# Patient Record
Sex: Male | Born: 1975 | Race: Asian | Hispanic: No | Marital: Single | Smoking: Former smoker
Health system: Southern US, Community
[De-identification: ages and names within clinical notes are randomized; demographics above are authoritative.]

## PROBLEM LIST (undated history)

## (undated) DIAGNOSIS — R109 Unspecified abdominal pain: Secondary | ICD-10-CM

## (undated) DIAGNOSIS — K219 Gastro-esophageal reflux disease without esophagitis: Secondary | ICD-10-CM

## (undated) HISTORY — DX: Gastro-esophageal reflux disease without esophagitis: K21.9

---

## 2015-02-16 ENCOUNTER — Ambulatory Visit (INDEPENDENT_AMBULATORY_CARE_PROVIDER_SITE_OTHER): Payer: PRIVATE HEALTH INSURANCE | Admitting: Family Medicine

## 2015-02-16 ENCOUNTER — Ambulatory Visit (INDEPENDENT_AMBULATORY_CARE_PROVIDER_SITE_OTHER): Payer: PRIVATE HEALTH INSURANCE

## 2015-02-16 VITALS — BP 132/92 | HR 64 | Temp 97.3°F | Resp 18 | Ht 64.0 in | Wt 113.0 lb

## 2015-02-16 DIAGNOSIS — Z131 Encounter for screening for diabetes mellitus: Secondary | ICD-10-CM | POA: Diagnosis not present

## 2015-02-16 DIAGNOSIS — Z1329 Encounter for screening for other suspected endocrine disorder: Secondary | ICD-10-CM

## 2015-02-16 DIAGNOSIS — R0789 Other chest pain: Secondary | ICD-10-CM

## 2015-02-16 DIAGNOSIS — Z13220 Encounter for screening for lipoid disorders: Secondary | ICD-10-CM | POA: Diagnosis not present

## 2015-02-16 DIAGNOSIS — M779 Enthesopathy, unspecified: Secondary | ICD-10-CM

## 2015-02-16 LAB — POCT CBC
Granulocyte percent: 51.8 %G (ref 37–80)
HCT, POC: 44.9 % (ref 43.5–53.7)
Hemoglobin: 14.3 g/dL (ref 14.1–18.1)
Lymph, poc: 4.7 — AB (ref 0.6–3.4)
MCH, POC: 29.8 pg (ref 27–31.2)
MCHC: 31.8 g/dL (ref 31.8–35.4)
MCV: 93.5 fL (ref 80–97)
MID (cbc): 0.7 (ref 0–0.9)
MPV: 7 fL (ref 0–99.8)
POC Granulocyte: 5.7 (ref 2–6.9)
POC LYMPH PERCENT: 42.1 %L (ref 10–50)
POC MID %: 6.1 %M (ref 0–12)
Platelet Count, POC: 343 10*3/uL (ref 142–424)
RBC: 4.8 M/uL (ref 4.69–6.13)
RDW, POC: 13.6 %
WBC: 11.1 10*3/uL — AB (ref 4.6–10.2)

## 2015-02-16 LAB — POCT GLYCOSYLATED HEMOGLOBIN (HGB A1C): Hemoglobin A1C: 5

## 2015-02-16 LAB — TROPONIN I: Troponin I: <0.01 ng/mL (ref <0.06)

## 2015-02-16 NOTE — Progress Notes (Signed)
Chief Complaint:  Chief Complaint  Patient presents with  . Chest Pain    x 2 weeks  . Arm Pain    x 2 weeks    HPI: Trevor Stanton is a 39 y.o. male who is here for  2-3 week history of these chest wall pain, more on the left side, associated with arm pain, numbness and tingling. He has had intermittent to constant chest wall pain, it can last all day long. It is not associated with rest or exertion onset, however it gets worse with exertion especially at work. Remember what triggered it. Has chest pain and arm pain when he moves in bed. 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 He uses his left hand many times to lift since he partners up with someone else on the other side. He is right-hand dominant. He denies any neck pain, shoulder pain, prior history of rotator cuff issues or elbow tendinitis. He denies any history of GERD, denies any prior history of tendinitis, he has had similar symptoms in the past in thick mom about 7-8 years ago involving chest wall pain and was worked up by a cardiologist. He is given a medication but does not know what it is called. He got relief from it has not taken it since.  Denies any recent cold symptoms, cough, allergies, respiratory illnesses, night sweats, chills, weight loss. He denies any shortness of breath, palpitations, diaphoresis, headache, nausea, vomiting, abdominal pain. Was a former smoker. About 4 years ago. Denies any hypertension, diabetes, family history of premature heart attacks, hyperlipidemia. Does not drink alcohol except socially.  History reviewed. No pertinent past medical history. History reviewed. No pertinent past surgical history. History   Social History  . Marital Status: Single    Spouse Name: N/A  . Number of Children: N/A  . Years of Education: N/A   Social History Main Topics  . Smoking status: Former Smoker -- 0.50 packs/day for 3 years    Types: Cigarettes  .  Smokeless tobacco: Not on file  . Alcohol Use: Not on file  . Drug Use: Not on file  . Sexual Activity: Not on file   Other Topics Concern  . None   Social History Narrative  . None   Family History  Problem Relation Age of Onset  . Diabetes Mother   . Hypertension Mother   . Hyperlipidemia Mother    No Known Allergies Prior to Admission medications   Not on File     ROS: The patient denies fevers, chills, night sweats, unintentional weight loss, palpitations, wheezing, dyspnea on exertion, nausea, vomiting, abdominal pain, dysuria, hematuria, melena,  weakness  All other systems have been reviewed and were otherwise negative with the exception of those mentioned in the HPI and as above.    PHYSICAL EXAM: Filed Vitals:   02/16/15 1610  BP: 132/92  Pulse: 64  Temp: 97.3 F (36.3 C)  Resp: 18   Filed Vitals:   02/16/15 1610  Height: 5\' 4"  (1.626 m)  Weight: 113 lb (51.256 kg)   Body mass index is 19.39 kg/(m^2).  General: Alert, no acute distress HEENT:  Normocephalic, atraumatic, oropharynx patent. EOMI, PERRLA, fundoscopic exam normal,  Cardiovascular:  Regular rate and rhythm, no rubs murmurs or gallops.  No Carotid bruits, radial pulse intact. No pedal edema.  + chest wall tenderness on palpation Respiratory: Clear to auscultation bilaterally.  No wheezes, rales, or  rhonchi.  No cyanosis, no use of accessory musculature GI: No organomegaly, abdomen is soft and non-tender, positive bowel sounds.  No masses. Skin: No rashes. Neurologic: Facial musculature symmetric. CN 2 to 12 grossly intact Psychiatric: Patient is appropriate throughout our interaction. Lymphatic: No cervical lymphadenopathy Musculoskeletal: Gait intact. 5/5 strength, 2/2 DTRs, UE and LE sensation intact He has + left  lateral epicondylitis.    LABS: Results for orders placed or performed in visit on 02/16/15  POCT CBC  Result Value Ref Range   WBC 11.1 (A) 4.6 - 10.2 K/uL   Lymph, poc  4.7 (A) 0.6 - 3.4   POC LYMPH PERCENT 42.1 10 - 50 %L   MID (cbc) 0.7 0 - 0.9   POC MID % 6.1 0 - 12 %M   POC Granulocyte 5.7 2 - 6.9   Granulocyte percent 51.8 37 - 80 %G   RBC 4.80 4.69 - 6.13 M/uL   Hemoglobin 14.3 14.1 - 18.1 g/dL   HCT, POC 44.9 43.5 - 53.7 %   MCV 93.5 80 - 97 fL   MCH, POC 29.8 27 - 31.2 pg   MCHC 31.8 31.8 - 35.4 g/dL   RDW, POC 13.6 %   Platelet Count, POC 343 142 - 424 K/uL   MPV 7.0 0 - 99.8 fL  POCT glycosylated hemoglobin (Hb A1C)  Result Value Ref Range   Hemoglobin A1C 5.0      EKG/XRAY:   Primary read interpreted by Dr. Marin Comment at [facility]. Neg for any acute cardiopulm process  ASSESSMENT/PLAN: Encounter Diagnoses  Name Primary?  . Other chest pain Yes  . Screening for thyroid disorder   . Screening for diabetes mellitus   . Screening for hyperlipidemia   . Chest wall pain   . Tendonitis    Chest pain presentation questionable etiology of costochondritis versus less likely MI. He has pain of the chest wall on palpation and range of motion. He has a normal chest x-ray and a borderline EKG with nonspecific ST changes. There was a slight leukocytosis but I think this is all inflammatory. Do not suspect that it is MI in etiology.  Labs pending: stat troponin i, cmp, tsh, lipid Refer to cardiology Work note given I will ask him to take tylenol for this, hold off on NSAIDs until eval by cardiology or labs are normal Precautions given to go to the ER as needed   Gross sideeffects, risk and benefits, and alternatives of medications d/w patient. Patient is aware that all medications have potential sideeffects and we are unable to predict every sideeffect or drug-drug interaction that may occur.  LE, Shipman, DO 02/16/2015 5:11 PM

## 2015-02-16 NOTE — Patient Instructions (Signed)

## 2015-02-17 LAB — LIPID PANEL
Cholesterol: 177 mg/dL (ref 0–200)
HDL: 63 mg/dL (ref >=40)
LDL Cholesterol: 98 mg/dL (ref 0–99)
Total CHOL/HDL Ratio: 2.8 Ratio
Triglycerides: 79 mg/dL (ref <150)
VLDL: 16 mg/dL (ref 0–40)

## 2015-02-17 LAB — COMPREHENSIVE METABOLIC PANEL
ALT: 13 U/L (ref 0–53)
AST: 18 U/L (ref 0–37)
Alkaline Phosphatase: 69 U/L (ref 39–117)
Sodium: 137 mEq/L (ref 135–145)
Total Bilirubin: 0.4 mg/dL (ref 0.2–1.2)
Total Protein: 7.7 g/dL (ref 6.0–8.3)

## 2015-02-17 LAB — COMPREHENSIVE METABOLIC PANEL WITH GFR
Albumin: 4.8 g/dL (ref 3.5–5.2)
BUN: 16 mg/dL (ref 6–23)
CO2: 26 meq/L (ref 19–32)
Calcium: 9.8 mg/dL (ref 8.4–10.5)
Chloride: 100 meq/L (ref 96–112)
Creat: 0.76 mg/dL (ref 0.50–1.35)
Glucose, Bld: 85 mg/dL (ref 70–99)
Potassium: 4 meq/L (ref 3.5–5.3)

## 2015-02-17 LAB — TSH: TSH: 3.679 u[IU]/mL (ref 0.350–4.500)

## 2015-02-20 ENCOUNTER — Encounter: Payer: Self-pay | Admitting: Family Medicine

## 2015-05-22 ENCOUNTER — Encounter: Payer: Self-pay | Admitting: Cardiology

## 2015-05-23 NOTE — Progress Notes (Signed)
This encounter was created in error - please disregard.

## 2019-08-01 ENCOUNTER — Ambulatory Visit: Payer: Self-pay | Admitting: Nurse Practitioner

## 2019-08-07 ENCOUNTER — Ambulatory Visit: Payer: Self-pay | Admitting: Nurse Practitioner

## 2019-08-13 ENCOUNTER — Encounter: Payer: Self-pay | Admitting: Nurse Practitioner

## 2019-08-13 ENCOUNTER — Other Ambulatory Visit: Payer: Self-pay

## 2019-08-13 ENCOUNTER — Ambulatory Visit (INDEPENDENT_AMBULATORY_CARE_PROVIDER_SITE_OTHER): Payer: BLUE CROSS/BLUE SHIELD | Admitting: Nurse Practitioner

## 2019-08-13 VITALS — BP 122/85 | HR 86 | Ht 63.0 in | Wt 108.6 lb

## 2019-08-13 DIAGNOSIS — K2950 Unspecified chronic gastritis without bleeding: Secondary | ICD-10-CM | POA: Diagnosis not present

## 2019-08-13 DIAGNOSIS — Z7689 Persons encountering health services in other specified circumstances: Secondary | ICD-10-CM | POA: Diagnosis not present

## 2019-08-13 DIAGNOSIS — R3914 Feeling of incomplete bladder emptying: Secondary | ICD-10-CM | POA: Diagnosis not present

## 2019-08-13 DIAGNOSIS — N401 Enlarged prostate with lower urinary tract symptoms: Secondary | ICD-10-CM | POA: Diagnosis not present

## 2019-08-13 MED ORDER — OMEPRAZOLE 20 MG PO CPDR: 20.0000 mg | DELAYED_RELEASE_CAPSULE | Freq: Every day | ORAL | 3 refills | Status: DC

## 2019-08-13 MED ORDER — TAMSULOSIN HCL 0.4 MG PO CAPS: 0.4000 mg | ORAL_CAPSULE | Freq: Every day | ORAL | 3 refills | Status: DC

## 2019-08-13 NOTE — Patient Instructions (Addendum)
Trevor Stanton,   Thank you for coming in to clinic today.  1. START omeprazole 20 mg once daily about 30 minutes before first meal.  B?nh tro ng??c d? dy th?c qu?n, Ng??i l?n Gastroesophageal Reflux Disease, Adult Tro ng??c d? dy th?c qu?n (GER) x?y ra khi axit t? d? dy tro ln ?ng k?t n?i mi?ng v d? dy (th?c qu?n). Thng th??ng, th?c ?n di chuy?n xu?ng th?c qu?n v ? trong d? dy ?? tiu ha. Tuy nhin, khi m?t ng??i b? GER, th?c ?n v axit trong d? dy ?i khi tro ng??c tr? [name] th?c qu?n. N?u tnh tr?ng ny tr? thnh m?t v?n ?? nghim tr?ng h?n, ng??i ? c th? ???c ch?n ?on b? b?nh g?i l b?nh tro ng??c d? dy th?c qu?n (GERD). GERD x?y ra khi tro ng??c:  Xu?t hi?n th??ng xuyn.  Gy ra cc tri?u ch?ng th??ng xuyn ho?c n?ng.  Gy ra cc v?n ?? nh? th??ng t?n th?c qu?n. Khi axit d? dy ti?p xc v?i th?c qu?n, axit c th? gy lot (vim) trong th?c qu?n. Theo th?i gian, GERD c th? t?o ra cc l? nh? (v?t lot) ? nim m?c th?c qu?n. Nguyn nhn g gy ra? Tnh tr?ng ny do m?t v?n ?? c?a ph?n c? gi?a th?c qu?n v d? dy (c? th?t th?c qu?n d??i, hay l lower esophageal sphincter, LES) gy ra. Thng th??ng c? LES ?ng l?i sau khi th?c ?n ?i qua th?c qu?n vo d? dy. Khi LES b? y?u ho?c b?t th??ng, c? khng ?ng theo ?ng cch v ?i?u ? khi?n cho th?c ?n v a xt d? dy tro ng??c tr? l?i th?c qu?n. LES c th? b? y?u do m?t s? ch?t ?n [place] nh?t ??nh, thu?c v cc tnh tr?ng b?nh l, bao g?m:  S? d?ng thu?c l.  Mang thai.  Thot v? honh.  S? d?ng r??u.  M?t s? lo?i th?c ?n v ?? u?ng nh?t ??nh, nh? c ph, s c la, hnh v b?c h. ?i?u g lm t?ng nguy c?? Tnh tr?ng ny d? x?y ra h?n n?u qu v?:  B? t?ng cn.  B? r?i lo?n m lin k?t.  S? d?ng thu?c ch?ng vim khng steroid (NSAID). Cc d?u hi?u ho?c tri?u ch?ng l g? Nh?ng tri?u ch?ng c?a tnh tr?ng ny bao g?m:  ? nng.  Kh nu?t ho?c ?au khi nu?t.  C?m th?y nh? c m?t c?c trong h?ng.  C?m gic ??ng trong  mi?ng.  H?i th? hi.  C nhi?u n??c b?t.  C?m gic kh ch?u trong b?ng ho?c ch??ng b?ng.  ? h?i.  ?au ng?c. Nh?ng tnh tr?ng khc nhau c th? gy ?au ng?c. B?o ??m qu v? ?i khm chuyn gia ch?m [name] s?c kh?e n?u qu v? b? ?au ng?c.  Kh th? ho?c th? kh kh.  Ho lin t?c (m?n tnh) ho?c ho vo ban ?m.  B? h?ng l?p men r?ng.  S?t cn. Ch?n ?on tnh tr?ng ny nh? th? no? Chuyn gia ch?m [name] s?c kh?e c?a qu v? s? h?i v? b?nh s? v khm th?c th? cho qu v?. ?? xc ??nh qu v? b? GERD nh? hay n?ng, chuyn gia ch?m  s?c kh?e c?ng c th? theo di qu v? ?p ?ng v?i vi?c ?i?u tr? nh? th? no. Qu v? c?ng c th? ???c lm cc ki?m tra, bao g?m:  M?t ki?m tra ?? khm d? dy v th?c qu?n b?ng m?t camera nh? (n?i soi).  Ki?m tra ?o n?ng ?? a  xt trong th?c qu?n c?a qu v?.  Ki?m tra ?o m?c p l?c ln th?c qu?n c?a qu v?.  Ki?m tra nu?t bari ho?c nu?t bari bi?n tnh ?? cho th?y hnh d?ng, kch th??c v ch?c n?ng c?a th?c qu?n c?a qu v?. Tnh tr?ng ny ???c ?i?u tr? nh? th? no? M?c tiu c?a ?i?u tr? l gip lm gi?m cc tri?u ch?ng v ng?n ng?a bi?n ch?ng. Vi?c ?i?u tr? b?nh ny c th? khc nhau ty thu?c m?c ?? n?ng c?a tri?u ch?ng. Chuyn gia ch?m [name] s?c kh?e c?a qu v? c th? khuy?n ngh?:  Thay ??i ch? ?? ?n.  Thu?c.  Ph?u thu?t. Tun th? nh?ng h??ng d?n ny ? nh: ?n v u?ng   Tun th? m?t ch? ?? ?n theo khuy?n ngh? c?a chuyn gia ch?m [name] s?c kh?e. Vi?c ny c th? l trnh cc th?c ?n v ?? u?ng nh?: ? C ph v tr (c ho?c khng c caffeine). ? ?? u?ng cr??u. ? ?? u?ng t?ng l?c v ?? u?ng dng trong th? thao. ? ?? u?ng c ga ho?c soda. ? S-c-la v cacao. ? B?c h v h??ng b?c h. ? T?i v hnh. ? C?i ng?a (Horseradish). ? Cc th?c ?n nhi?u gia v? v axit, bao g?m h?t tiu, b?t ?t, b?t cri, gi?m, n??c s?t cay v n??c s?t th?t n??ng. ? N??c qu? ho?c qu? h? cam qut, ch?ng h?n nh? cam, chanh v chanh l cam. ? Cc th?c ?n c c chua, nh? n??c x?t ??, ?t, n??c  x?t salsa v pizza km x?t ??. ? Th?c ?n chin v nhi?u ch?t bo, ch?ng h?n nh? bnh rn, khoai ty chin, khoai ty rn v n??c x?t nhi?u ch?t bo. ? Th?t nhi?u ch?t bo, ch?ng h?n nh? hot dog (bnh m k?p xc xch) v cc lo?i th?t ?? v tr?ng nhi?u m?, ch?ng h?n nh? th?t n?c l?ng, xc xch, gi?m bng v th?t l?n xng khi. ? Nh?ng s?n ph?m b? s?a giu ch?t bo, nh? s?a nguyn kem, b? v pho mt kem.  ?n cc b?a nh?, th??ng xuyn thay v cc b?a no.  Trnh u?ng nhi?u n??c khi ?n.  Trnh ?n trong kho?ng 2-3 gi? tr??c khi ?i ng?.  Trnh n?m xu?ng ngay sau khi ?n.  Khng t?p th? d?c ngay sau khi ?n. L?i s?ng   Khng s? d?ng b?t k? s?n ph?m no c nicotine ho?c thu?c l, ch?ng ha?n nh? thu?c l d?ng ht, thu?c l ?i?n t? v thu?c l d?ng nhai. N?u qu v? c?n gip ?? ?? cai thu?c, hy h?i chuyn gia ch?m [name] s?c kh?e.  C? g?ng gi?m c?ng th?ng b?ng cch s? d?ng cc ph??ng php nh? yoga ho?c thi?n. N?u qu v? c?n gip ?? ?? lm gi?m c?ng th?ng, hy h?i chuyn gia ch?m [name] s?c kh?e.  N?u qu v? th?a cn, hy gi?m cn v? m?c c l?i cho s?c kh?e c?a qu v?. Hy h?i chuyn gia ch?m [name] s?c kh?e ?? ???c h??ng d?n v? m?c tiu gi?m cn an ton. H??ng d?n chung  Ch  ??n b?t c? thay ??i no v? tri?u ch?ng c?a qu v?.  Ch? s? d?ng thu?c khng k ??n v thu?c k ??n theo ch? d?n c?a chuyn gia ch?m [place] s?c kh?e. Khng dng aspirin, ibuprofen, ho?c cc thu?c ch?ng vim khng steroid (NSAID) khc tr? khi chuyn gia ch?m [name] s?c kh?e c?a qu v? cho php.  M?c qu?n o r?ng. Khng m?c ci g ch?t  quanh eo m c th? t?o p l?c ln b?ng.  Nng (nng cao) ??u gi??ng c?a qu v? thm 6 ins? (15 cm).  Trnh g?p ng??i n?u ?i?u ny khi?n cc tri?u ch?ng tr? nn tr?m tr?ng h?n.  Tun th? t?t c? cc l?n khm theo di theo ch? d?n c?a chuyn gia ch?m [name] s?c kh?e. ?i?u ny c vai tr quan tr?ng. Hy lin l?c v?i chuyn gia ch?m [name] s?c kh?e n?u:  Qu v? bi?: ? Cc tri?u ch?ng m?i. ? S?t cn khng r nguyn  nhn. ? Kh nu?t ho?c b? ?au khi nu?t. ? Th? kh kh ho?c ho dai d?ng. ? Gi?ng khn.  Cc tri?u ch?ng c?a qu v? khng c?i thi?n sau khi ???c ?i?u tr?Trevor Stanton c?u tr? gip ngay l?p t?c n?u qu v?:  B? ?au ? cnh tay, c?, hm, r?ng ho?c l?ng.  C?m th?y ?? m? hi, chng m?t ho?c chong vng.  B? ?au ng?c ho?c kh th?.  Nn v ch?t nn ra gi?ng nh? mu ho?c b c ph.  Ng?t x?u.  C phn l?n mu ho?c mu ?en.  Khng th? nu?t, u?ng ho?c ?n. Tm t?t  Tro ng??c d? dy th?c qu?n x?y ra khi axit t? d? dy tro ln th?c qu?n. GERD l b?nh trong ? tro ng??c x?y ra th??ng xuyn, gy ra cc tri?u ch?ng n?ng ho?c th??ng xuyn, ho?c gy ra cc v?n ?? nh? t?n th??ng th?c qu?n.  Vi?c ?i?u tr? b?nh ny c th? khc nhau ty thu?c m?c ?? n?ng c?a tri?u ch?ng. Chuyn gia ch?m [place] s?c kh?e c th? khuy?n ngh? thay ??i ch? ?? ?n u?ng v l?i s?ng, dng thu?c ho?c ph?u thu?t.  Hy lin h? v?i chuyn gia ch?m [place] s?c kh?e n?u qu v? c tri?u ch?ng m?i ho?c tri?u ch?ng ? nn tr?m tr?ng h?n.  Ch? s? d?ng thu?c khng k ??n v thu?c k ??n theo ch? d?n c?a chuyn gia ch?m [place] s?c kh?e. Khng dng aspirin, ibuprofen, ho?c cc thu?c ch?ng vim khng steroid (NSAID) khc tr? khi chuyn gia ch?m [place] s?c kh?e c?a qu v? cho php.  Tun th? t?t c? cc l?n khm theo di theo ch? d?n c?a chuyn gia ch?m [place] s?c kh?e. ?i?u ny c vai tr quan tr?ng. Thng tin ny khng nh?m m?c ?ch thay th? cho l?i khuyn m chuyn gia ch?m [place] s?c kh?e ni v?i qu v?. Hy b?o ??m qu v? ph?i th?o lu?n b?t k? v?n ?? g m qu v? c v?i chuyn gia ch?m  s?c kh?e c?a qu v?. Document Released: 08/25/2005 Document Revised: 06/23/2018 Document Reviewed: 06/23/2018 Elsevier Patient Education  [place].   T?ng s?n tuy?n ti?n li?t lnh tnh Benign Prostatic Hyperplasia  T?ng s?n tuy?n ti?n li?t lnh tnh (BPH) l tuy?n ti?n li?t ph ??i do qu trnh lo ha bnh th??ng ch? khng ph?i do ung th? gy ra. Tuy?n ti?n li?t l  tuy?n c kch th??c b?ng qu? h? ?o, tham gia vo qu trnh s?n xu?t tinh d?ch. N n?m pha tr??c tr?c trng v bn d??i bng quang. Bng quang l?u tr? n??c ti?u v ni?u ??o l ?ng ??a n??c ti?u ra kh?i c? th?. Tuy?n ti?n li?t c th? l?n h?n khi ?n ng nhi?u tu?i h?n. Tuy?n ti?n li?t ph ??i c th? chn p vo ni?u ??o. ?i?u ny c th? lm cho kh ?i ti?u h?n. Tch t? n??c ti?u trong bng quang c th? gy nhi?m trng. p  l?c ng??c dng v nhi?m trng c th? ti?n tri?n thnh t?n th??ng bng quang v suy th?n. Nguyn nhn g gy ra? Tnh tr?ng ny l m?t ph?n c?a qu trnh lo ha bnh th??ng. Tuy nhin, khng ph?i t?t c? nam gi?i ??u b? cc v?n ?? c?a tnh tr?ng ny. N?u tuy?n ti?n li?t ph ??i ra xa ni?u ??o, dng n??c ti?u s? khng b? ch?n. N?u n ph ??i h??ng v? ni?u ??o v chn p ni?u ??o, s? c v?n ?? v? ti?u ti?n. ?i?u g lm t?ng nguy c?? Tnh tr?ng ny d? x?y ra h?n ? nam gi?i trn 50 tu?i. Cc d?u hi?u ho?c tri?u ch?ng g? Nh?ng tri?u ch?ng c?a tnh tr?ng ny bao g?m:  Th?c d?y th??ng xuyn trong ?m ?? ?i ti?u.  C?n ?i ti?u th??ng xuyn trong ngy.  Kh b?t ??u dng n??c ti?u.  Gi?m kch th??c v ?? m?nh c?a dng n??c ti?u.  R? (nh? gi?t) sau khi ti?u ti?n.  Khng th? ?i ti?u. Tnh tr?ng ny c?n ph?i ?i?u tr? ngay l?p t?c.  Khng th? ?i h?t n??c ti?u trong bng quang.  ?au khi ?i ti?u. Tnh tr?ng ny ph? bi?n h?n n?u c?ng km theo nhi?m trng.  Nhi?m trng ???ng ti?u (UTI). Ch?n ?on tnh tr?ng ny nh? th? no? Tnh tr?ng ny ???c ch?n ?on d?a vo khm th?c th?, khai thc b?nh s? v cc tri?u ch?ng c?a qu v?. Cc ki?m tra c?ng s? ???c th?c hi?n, ch?ng h?n nh?:  Ch?p bng quang sau khi ?i ti?u. Bi?n php ny ?o b?t c? l??ng n??c ti?u no cn l?i trong bng quang sau khi qu v? ?i ti?u xong.  Khm tr?c trng b?ng ngn tay. Trong l?n ki?m tra tr?c trng, chuyn gia ch?m [place] s?c kh?e s? ki?m tra tuy?n ti?n li?t c?a qu v? b?ng cch cho m?t ngn tay ?eo g?ng, ? bi tr?n vo  tr?c trng ?? s? m?t sau c?a tuy?n ti?n li?t. Ki?m tra ny pht hi?n kch th??c c?a tuy?n v b?t c? kh?i u ho?c s? pht tri?n b?t th??ng no.  Xt nghi?m n??c ti?u (phn tch n??c ti?u).  Sng l?c khng nguyn ??c hi?u tuy?n ti?n li?t (PSA). ?y l m?t xt nghi?m mu ???c s? d?ng ?? sng l?c ung th? tuy?n ti?n li?t.  Siu m. Ki?m tra ny s? d?ng sng m thanh ?? t?o hnh ?nh tuy?n ti?n li?t b?ng ph??ng php ?i?n t?. Chuyn gia ch?m [place] s?c kh?e c th? gi?i thi?u qu v? ??n m?t chuyn gia v? b?nh th?n v b?nh tuy?n ti?n li?t (bc s? chuyn khoa ti?t ni?u). Tnh tr?ng ny ???c ?i?u tr? nh? th? no? Khi b?t ??u c tri?u ch?ng, chuyn gia ch?m [place] s?c kh?e s? theo di tnh tr?ng c?a qu v? (theo di tch c?c ho?c ch? tch c?c). Vi?c ?i?u tr? ti?nh tra?ng ny ty thu?c vo m??c ?? n?ng c?a b?nh. ?i?u tr? c th? bao g?m:  Theo di v khm th??ng nin. ?y c th? l cch ?i?u tr? duy nh?t c?n thi?t n?u tnh tr?ng v cc tri?u ch?ng c?a qu v? nh?.  Thu?c ?? gia?m nhe? ca?c tri?u ch??ng, bao g?m: ? Thu?c lm thu nh? tuy?n ti?n li?t. ? Thu?c ?? th? gin cc c? c?a tuy?n ti?n li?t.  Ph?u thu?t trong tr??ng h?p n?ng. Ph?u thu?t c th? bao g?m: ? C?t b? tuy?n ti?n li?t. Trong th? thu?t ny, m tuy?n ti?n li?t ???c ???c lo?i b? hon ton qua m?t  v?t m? m? ho?c qua n?i soi ? b?ng ho?c robot. ? C?t tuy?n ti?n li?t qua ni?u ??o (TURP). Trong th? thu?t ny, m?t d?ng c? ???c lu?n qua l? trn ??u d??ng v?t (ni?u ??o). N ???c s? d?ng ?? c?t b? m li bn trong c?a tuy?n ti?n li?t. Cc m?nh ny ???c lo?i b? thng qua cng m?t l? ? ??u d??ng v?t. Ph??ng php ny gip lo?i b? t?c ngh?n. ? R?ch qua ni?u ??o (TUIP). Trong th? thu?t ny, cc v?t r?ch nh? ???c t?o ra trong tuy?n ti?n li?t. Th? thu?t ny lm gi?m p l?c c?a tuy?n ti?n li?t ln ni?u ??o. ? Li?u php nhi?t vi sng ni?u ??o (TUMT). Th? thu?t ny s? d?ng vi sng ?? t?o ra nhi?t. Nhi?t s? ph h?y v lo?i b? m?t l??ng nh? m tuy?n ti?n li?t. ? B?c h?i  tuy?n ti?n li?t b?ng kim qua ni?u ??o (TUNA). Th? thu?t ny s? d?ng t?n s? radio ?? ph h?y v lo?i b? m?t l??ng nh? m tuy?n ti?n li?t. ? ?ng t? b?ng laser qua k? ([place]). Th? thu?t ny s? d?ng tia laser ?? ph h?y v lo?i b? m?t l??ng nh? m tuy?n ti?n li?t. ? Lm b?c h?i tuy?n ti?n li?t qua ni?u ??o (TUVP). Th? thu?t ny s? d?ng cc ?i?n c?c ?? ph h?y v lo?i b? m?t l??ng nh? m tuy?n ti?n li?t. ? Nng ni?u ??o tuy?n ti?n li?t. Th? thu?t ny ??a m?t m c?y vo ?? ??y cc thy c?a tuy?n ti?n li?t ra xa ni?u ??o. Tun th? nh?ng h??ng d?n ny ? nh:  Ch? s? d?ng thu?c khng k ??n v thu?c k ??n theo ch? d?n c?a chuyn gia ch?m [place] s?c kh?e.  Theo di cc tri?u ch?ng xem c b?t c? thay ??i no khng. Hy trao ??i v?i chuyn gia ch?m [place] s?c kh?e n?u c b?t k? thay ??i no.  Trnh u?ng nhi?u n??c tr??c khi ?i ng? ho?c ra ngoi n?i cng c?ng.  Trnh u?ng ho?c gi?m l??ng caffeine ho?c gi?m l??ng r??u qu v? u?ng.  T? cho qu v? th?i gian khi ti?u ti?n.  Tun th? t?t c? cc l?n khm theo di theo ch? d?n c?a chuyn gia ch?m [place] s?c kh?e. ?i?u ny c vai tr quan tr?ng. Hy lin l?c v?i chuyn gia ch?m [place] s?c kh?e n?u:  Qu v? b? ?au l?ng khng r nguyn nhn.  Cc tri?u ch?ng c?a qu v? khng ?? h?n sau khi ???c ?i?u tr?Sander Nephew v? b? nh?ng tc d?ng ph? c?a thu?c m qu v? ?ang dng.  N??c ti?u c?a qu v? tr? nn r?t s?m mu ho?c c mi hi.  Vng b?ng d??i c?a qu v? ch??ng ln v qu v? ?i ti?u kh kh?n. Yu c?u tr? gip ngay l?p t?c n?u:  Qu v? b? s?t ho?c ?n l?nh.  Qu v? ??t nhin khng th? ti?u ti?n.  Qu v? c?m th?y chong vng, ho?c r?t chng m?t, ho?c ng?t x?u.  C m?t l??ng l?n mu ho?c c?c mu ?ng trong n??c ti?u.  V?n ?? ti?u ti?n tr? ln kh qu?n l.  Qu v? b? ?au th?t l?ng ho?c ?au m?ng s??n t? m?c ?? trung bnh ??n m?c ?? n?ng. M?ng s??n l phn bn c? th? gi?a x??ng s??n v hng. Nh?ng tri?u ch?ng ny c th? l bi?u hi?n c?a m?t v?n ?? nghim tr?ng c?n c?p c?u.  Khng ch? xem tri?u ch?ng c h?t khng. Hy ?i khm ngay l?p  t?c. G?i cho d?ch v? c?p c?u t?i ??a ph??ng (911 ? [place] Khng t? li xe ??n b?nh vi?n. Tm t?t  T?ng s?n tuy?n ti?n li?t lnh tnh ([facility]) l tuy?n ti?n li?t ph ??i do qu trnh lo ha bnh th??ng ch? khng ph?i do ung th? gy ra.  Tuy?n ti?n li?t ph ??i c th? chn p vo ni?u ??o. ?i?u ny c th? lm cho kh ?i ti?u.  Tnh tr?ng ny l m?t ph?n c?a qu trnh lo ha bnh th??ng v d? x?y ra h?n ? nam gi?i trn 50 tu?i.  Yu c?u tr? gip ngay l?p t?c n?u qu v? ??t nhin khng th? ti?u ti?n. Thng tin ny khng nh?m m?c ?ch thay th? cho l?i khuyn m chuyn gia ch?m [place] s?c kh?e ni v?i qu v?. Hy b?o ??m qu v? ph?i th?o lu?n b?t k? v?n ?? g m qu v? c v?i chuyn gia ch?m [place] s?c kh?e c?a qu v?. Document Released: 11/15/2005 Document Revised: 11/27/2018 Document Reviewed: 11/27/2018 Elsevier Patient Education  2020 [place].   Please schedule a follow-up appointment with Cassell Smiles, AGNP. Return in about 3 months (around 11/12/2019) for GERD.  Call in 2 weeks as needed if no improvement.  If you have any other questions or concerns, please feel free to call the clinic or send a message through [place]. You may also schedule an earlier appointment if necessary.  You will receive a survey after today's visit either digitally by e-mail or paper by [employer]. Your experiences and feedback matter to [place].  Please respond so we know how we are doing as we provide care for you.   Cassell Smiles, DNP, AGNP-BC Adult Gerontology Nurse Practitioner [place]

## 2019-08-13 NOTE — Progress Notes (Signed)
Subjective:    Patient ID: Trevor Stanton, male    DOB: Apr 06, 1976, 43 y.o.   MRN: [id number]  Freemon Reath is a 43 y.o. male presenting on 08/13/2019 for Establish Care (constant burning abdomen pain that causes lack of appetite. If he doesn't eat it seem like the pain worsen. The pain start in the abdomen and radiates up the chest and around the back.   x  1 month. )   HPI Establish Care New Provider Pt last seen by PCP 1 years ago.  Obtain records from [place] - no known provider name.    Abdominal pain Since he cannot eat, he has worsening pain. - Patient has taken pepto bismol and alka seltzer- These help a little, but always returns. - No blood in stool, however, is black.   - weight loss in last 1 month has lost 5-6 lbs.  Colonoscopy - question about starting screening - patient not aware of start age.  Informed him it is at age 55.  Urine incontinence Urinary incontinence only happens at night.  None during day. Voids in bed and this is what wakes him.  This occurs once per week.  Onset of problem was more than 3 months ago. Urgency: Yes daytime and nighttime Frequency: 2-3 times per day only and voids small amounts Dribble: yes Incomplete emptying: yes Start stream/stop stream: no  History reviewed. No pertinent past medical history. History reviewed. No pertinent surgical history.   Social History   Socioeconomic History  . Marital status: Single    Spouse name: n/a  . Number of children: 0  . Years of education: Not on file  . Highest education level: 9th grade  Occupational History  . Not on file  Social Needs  . Financial resource strain: Not on file  . Food insecurity    Worry: Not on file    Inability: Not on file  . Transportation needs    Medical: Not on file    Non-medical: Not on file  Tobacco Use  . Smoking status: Former Smoker    Packs/day: 0.50    Years: 5.00    Pack years: 2.50    Types: Cigarettes  . Smokeless tobacco: Former Systems developer    Quit  date: 08/12/2016  Substance and Sexual Activity  . Alcohol use: Yes    Alcohol/week: 0.0 standard drinks    Frequency: Never    Comment: occasionally  . Drug use: Never  . Sexual activity: Not Currently  Lifestyle  . Physical activity    Days per week: Not on file    Minutes per session: Not on file  . Stress: Not on file  Relationships  . Social Herbalist on phone: Not on file    Gets together: Not on file    Attends religious service: Not on file    Active member of club or organization: Not on file    Attends meetings of clubs or organizations: Not on file    Relationship status: Not on file  . Intimate partner violence    Fear of current or ex partner: Not on file    Emotionally abused: Not on file    Physically abused: Not on file    Forced sexual activity: Not on file  Other Topics Concern  . Not on file  Social History Narrative   Works in a Company secretary   Family History  Problem Relation Age of Onset  . Diabetes Mother   . Hypertension Mother   .  Hyperlipidemia Mother    Current Outpatient Medications on File Prior to Visit  Medication Sig  . bismuth subsalicylate (PEPTO BISMOL) 262 MG/15ML suspension Take 30 mLs by mouth every 6 (six) hours as needed.   No current facility-administered medications on file prior to visit.     Review of Systems Per HPI unless specifically indicated above      Objective:    BP 122/85 (BP Location: Right Arm, Patient Position: Sitting, Cuff Size: Normal)   Pulse 86   Ht 5\' 3"  (1.6 m)   Wt 108 lb 9.6 oz (49.3 kg)   BMI 19.24 kg/m   Wt Readings from Last 3 Encounters:  08/13/19 108 lb 9.6 oz (49.3 kg)  02/16/15 113 lb (51.3 kg)    Physical Exam Vitals signs reviewed. Exam conducted with a chaperone present.  Constitutional:      General: He is not in acute distress.    Appearance: He is well-developed.  HENT:     Head: Normocephalic and atraumatic.  Cardiovascular:     Rate and Rhythm: Normal rate and  regular rhythm.     Pulses:          Radial pulses are 2+ on the right side and 2+ on the left side.       Posterior tibial pulses are 1+ on the right side and 1+ on the left side.     Heart sounds: Normal heart sounds, S1 normal and S2 normal.  Pulmonary:     Effort: Pulmonary effort is normal. No respiratory distress.     Breath sounds: Normal breath sounds and air entry.  Abdominal:     General: Abdomen is flat.     Tenderness: There is abdominal tenderness (most severe epigastric, moderate tenderness in LUQ and RUQ) in the right upper quadrant, epigastric area and left upper quadrant. There is guarding (epigastric region only). There is no rebound. Negative signs include Murphy's sign and McBurney's sign.  Genitourinary:    Comments: Genital and Rectal Exam  Chaperoned by interpreter Rectal/DRE: Normal external exam without hemorrhoids fissures or abnormality. DRE with palpation of enlarged prostate (50-60g) smooth symmetrical without nodule or tenderness. Musculoskeletal:     Right lower leg: No edema.     Left lower leg: No edema.  Skin:    General: Skin is warm and dry.     Capillary Refill: Capillary refill takes less than 2 seconds.  Neurological:     General: No focal deficit present.     Mental Status: He is alert and oriented to person, place, and time.  Psychiatric:        Attention and Perception: Attention normal.        Mood and Affect: Mood and affect normal.        Behavior: Behavior normal. Behavior is cooperative.    Results for orders placed or performed in visit on 02/16/15  Comprehensive metabolic panel  Result Value Ref Range   Sodium 137 135 - 145 mEq/L   Potassium 4.0 3.5 - 5.3 mEq/L   Chloride 100 96 - 112 mEq/L   CO2 26 19 - 32 mEq/L   Glucose, Bld 85 70 - 99 mg/dL   BUN 16 6 - 23 mg/dL   Creat 0.76 0.50 - 1.35 mg/dL   Total Bilirubin 0.4 0.2 - 1.2 mg/dL   Alkaline Phosphatase 69 39 - 117 U/L   AST 18 0 - 37 U/L   ALT 13 0 - 53 U/L   Total  Protein 7.7  6.0 - 8.3 g/dL   Albumin 4.8 3.5 - 5.2 g/dL   Calcium 9.8 8.4 - 10.5 mg/dL  TSH  Result Value Ref Range   TSH 3.679 0.350 - 4.500 uIU/mL  Lipid panel  Result Value Ref Range   Cholesterol 177 0 - 200 mg/dL   Triglycerides 79 <150 mg/dL   HDL 63 >=40 mg/dL   Total CHOL/HDL Ratio 2.8 Ratio   VLDL 16 0 - 40 mg/dL   LDL Cholesterol 98 0 - 99 mg/dL  Troponin I  Result Value Ref Range   Troponin I <0.01 <0.06 ng/mL  POCT CBC  Result Value Ref Range   WBC 11.1 (A) 4.6 - 10.2 K/uL   Lymph, poc 4.7 (A) 0.6 - 3.4   POC LYMPH PERCENT 42.1 10 - 50 %L   MID (cbc) 0.7 0 - 0.9   POC MID % 6.1 0 - 12 %M   POC Granulocyte 5.7 2 - 6.9   Granulocyte percent 51.8 37 - 80 %G   RBC 4.80 4.69 - 6.13 M/uL   Hemoglobin 14.3 14.1 - 18.1 g/dL   HCT, POC 44.9 43.5 - 53.7 %   MCV 93.5 80 - 97 fL   MCH, POC 29.8 27 - 31.2 pg   MCHC 31.8 31.8 - 35.4 g/dL   RDW, POC 13.6 %   Platelet Count, POC 343 142 - 424 K/uL   MPV 7.0 0 - 99.8 fL  POCT glycosylated hemoglobin (Hb A1C)  Result Value Ref Range   Hemoglobin A1C 5.0       Assessment & Plan:   Problem List Items Addressed This Visit    None    Visit Diagnoses    Other chronic gastritis, presence of bleeding unspecified    -  Primary Patient with increased stomach pain, worsens with eating.  Possible gastritis vs duodenal ulcer.  Uncontrolled GERD also likely.  Cannot fully exclude cholecystitis, pancreatitis.  Plan: 1. START omeprazole 20 mg once daily with breakfast 2. Black BM likely due to pepto bismol.  Instructed to monitor for color change away from black if he is able to stop pepto bismol. May continue prn. 3. Labs today 4. Follow-up 2 weeks if no improvement on medication AND in 3 months for routine follow-up of medications.    Relevant Medications   omeprazole (PRILOSEC) 20 MG capsule   Other Relevant Orders   COMPLETE METABOLIC PANEL WITH GFR   CBC with Differential/Platelet   Benign prostatic hyperplasia with  incomplete bladder emptying     Patient with severe LUTS symptoms.  Impact is present on reduced quality of life with bladder incontinence.  Significantly enlarged prostate present on exam.  Plan: 1. Labs today 2. START Flomax 0.4 mg once daily.  May need higher dose or dual therapy with alpha blocker. 3. Consider future referral to urology if symptoms not improving.  Low threshold for referral due to symptom severity 4. Patient verbalizes understanding - FOLLOW-UP 3 months or sooner if needed for no improvement.   Relevant Medications   tamsulosin (FLOMAX) 0.4 MG CAPS capsule   Other Relevant Orders   PSA   Encounter to establish care     Previous PCP was at [facility] > 2 years ago.  Records will not be requested.  Past medical, family, and surgical history reviewed w/ pt.        Meds ordered this encounter  Medications  . omeprazole (PRILOSEC) 20 MG capsule    Sig: Take 1 capsule (20 mg total)  by mouth daily.    Dispense:  30 capsule    Refill:  3    Order Specific Question:   Supervising Provider    Answer:   Olin Hauser [[id number]]     Follow up plan: Return in about 3 months (around 11/12/2019) for GERD.  Cassell Smiles, DNP, AGPCNP-BC Adult Gerontology Primary Care Nurse Practitioner [facility] 08/13/2019, 1:53 PM

## 2019-09-13 ENCOUNTER — Encounter: Payer: Self-pay | Admitting: Nurse Practitioner

## 2019-09-13 ENCOUNTER — Other Ambulatory Visit: Payer: Self-pay

## 2019-09-13 ENCOUNTER — Ambulatory Visit (INDEPENDENT_AMBULATORY_CARE_PROVIDER_SITE_OTHER): Payer: BLUE CROSS/BLUE SHIELD | Admitting: Nurse Practitioner

## 2019-09-13 VITALS — BP 102/70 | HR 80 | Ht 63.0 in | Wt 108.2 lb

## 2019-09-13 DIAGNOSIS — R1013 Epigastric pain: Secondary | ICD-10-CM | POA: Diagnosis not present

## 2019-09-13 DIAGNOSIS — Z23 Encounter for immunization: Secondary | ICD-10-CM | POA: Diagnosis not present

## 2019-09-13 DIAGNOSIS — R079 Chest pain, unspecified: Secondary | ICD-10-CM | POA: Diagnosis not present

## 2019-09-13 NOTE — Patient Instructions (Addendum)
Trevor Stanton,   Thank you for coming in to clinic today.  Please schedule a follow-up appointment. Return in about 2 months (around 11/13/2019) for epigastric stomach pain.   Come sooner if you have seen Heart doctor and still have problems. Call for GI referral if needed after cardiology workup (heart doctor).  If you have any other questions or concerns, please feel free to call the clinic or send a message through [place]. You may also schedule an earlier appointment if necessary.  You will receive a survey after today's visit either digitally by e-mail or paper by [employer]. Your experiences and feedback matter to [place].  Please respond so we know how we are doing as we provide care for you.  Cassell Smiles, DNP, AGNP-BC Adult Gerontology Nurse Practitioner [place]

## 2019-09-13 NOTE — Progress Notes (Signed)
Subjective:    Patient ID: Trevor Stanton, male    DOB: 12-12-1975, 43 y.o.   MRN: [id number]  Trevor Stanton is a 43 y.o. male presenting on 09/13/2019 for Abdominal Cramping (presistent abdominal pain that radiates up to the chest x 1 yr)  Patient is accompanied today by [facility] interpreter Trevor Stanton.  HPI Epigastric pain Patient presents in follow-up from visit on 08/13/2019.  At that visit, he was complaining of same pain, was treated initially for gastritis/esophagitis as that was most likely diagnosis.  Patient has continue omeprazole 20 mg once daily without any improvement.   - Patient has pain most in the late afternoon.  Starts in epigastric area with tightening in stomach and tightness/throbbing in chest with more tightness than throbbing.  Patient's pain is occurring with activity and rest.   - patient denies arm/jaw pain.  States the pain "stops in chest." - Associated symptoms: Admits mild shortness of breath.  Sometimes has sweating.  Feels like it is hot, radiating in his body. - Patient has taken omeprazole for 1 month for gastritis/GERD without improvement.  Social History   Tobacco Use  . Smoking status: Former Smoker    Packs/day: 0.50    Years: 5.00    Pack years: 2.50    Types: Cigarettes  . Smokeless tobacco: Former Systems developer    Quit date: 08/12/2016  Substance Use Topics  . Alcohol use: Yes    Alcohol/week: 0.0 standard drinks    Frequency: Never    Comment: occasionally  . Drug use: Never    Review of Systems Per HPI unless specifically indicated above     Objective:    BP 102/70   Pulse 80   Ht 5\' 3"  (1.6 m)   Wt 108 lb 3.2 oz (49.1 kg)   BMI 19.17 kg/m   Wt Readings from Last 3 Encounters:  09/13/19 108 lb 3.2 oz (49.1 kg)  08/13/19 108 lb 9.6 oz (49.3 kg)  02/16/15 113 lb (51.3 kg)    Physical Exam Vitals signs reviewed.  Constitutional:      General: He is not in acute distress.    Appearance: He is well-developed.  HENT:     Head: Normocephalic and  atraumatic.  Cardiovascular:     Rate and Rhythm: Normal rate and regular rhythm.     Pulses: Normal pulses.     Heart sounds: Normal heart sounds. No murmur. No friction rub. No gallop.   Pulmonary:     Effort: Pulmonary effort is normal.     Breath sounds: Normal breath sounds.  Abdominal:     General: Abdomen is flat. Bowel sounds are normal.     Palpations: Abdomen is soft.     Tenderness: There is abdominal tenderness (most tender in epigastric area with grimacing on palpation) in the right upper quadrant, epigastric area and left upper quadrant. There is guarding. There is no right CVA tenderness, left CVA tenderness or rebound. Negative signs include Murphy's sign, Rovsing's sign, McBurney's sign, psoas sign and obturator sign.     Hernia: No hernia is present.  Skin:    General: Skin is warm and dry.     Capillary Refill: Capillary refill takes less than 2 seconds.  Neurological:     General: No focal deficit present.     Mental Status: He is alert and oriented to person, place, and time. Mental status is at baseline.  Psychiatric:        Mood and Affect: Mood normal.  Behavior: Behavior normal.      Results for orders placed or performed in visit on 02/16/15  Comprehensive metabolic panel  Result Value Ref Range   Sodium 137 135 - 145 mEq/L   Potassium 4.0 3.5 - 5.3 mEq/L   Chloride 100 96 - 112 mEq/L   CO2 26 19 - 32 mEq/L   Glucose, Bld 85 70 - 99 mg/dL   BUN 16 6 - 23 mg/dL   Creat 0.76 0.50 - 1.35 mg/dL   Total Bilirubin 0.4 0.2 - 1.2 mg/dL   Alkaline Phosphatase 69 39 - 117 U/L   AST 18 0 - 37 U/L   ALT 13 0 - 53 U/L   Total Protein 7.7 6.0 - 8.3 g/dL   Albumin 4.8 3.5 - 5.2 g/dL   Calcium 9.8 8.4 - 10.5 mg/dL  TSH  Result Value Ref Range   TSH 3.679 0.350 - 4.500 uIU/mL  Lipid panel  Result Value Ref Range   Cholesterol 177 0 - 200 mg/dL   Triglycerides 79 <150 mg/dL   HDL 63 >=40 mg/dL   Total CHOL/HDL Ratio 2.8 Ratio   VLDL 16 0 - 40 mg/dL    LDL Cholesterol 98 0 - 99 mg/dL  Troponin I  Result Value Ref Range   Troponin I <0.01 <0.06 ng/mL  POCT CBC  Result Value Ref Range   WBC 11.1 (A) 4.6 - 10.2 K/uL   Lymph, poc 4.7 (A) 0.6 - 3.4   POC LYMPH PERCENT 42.1 10 - 50 %L   MID (cbc) 0.7 0 - 0.9   POC MID % 6.1 0 - 12 %M   POC Granulocyte 5.7 2 - 6.9   Granulocyte percent 51.8 37 - 80 %G   RBC 4.80 4.69 - 6.13 M/uL   Hemoglobin 14.3 14.1 - 18.1 g/dL   HCT, POC 44.9 43.5 - 53.7 %   MCV 93.5 80 - 97 fL   MCH, POC 29.8 27 - 31.2 pg   MCHC 31.8 31.8 - 35.4 g/dL   RDW, POC 13.6 %   Platelet Count, POC 343 142 - 424 K/uL   MPV 7.0 0 - 99.8 fL  POCT glycosylated hemoglobin (Hb A1C)  Result Value Ref Range   Hemoglobin A1C 5.0       Assessment & Plan:   Problem List Items Addressed This Visit    None    Visit Diagnoses    Chest pain, unspecified type    -  Primary   Relevant Orders   Ambulatory referral to Cardiology   Comprehensive metabolic panel (Completed)   Lipid panel (Completed)   Needs flu shot       Relevant Orders   Flu Vaccine QUAD 36+ mos IM (Completed)   Epigastric pain       Relevant Orders   Comprehensive metabolic panel (Completed)   Lipase (Completed)      Chest pain and epigastric pain Stable, but remains uncontrolled on low dose PPI.  Differential diagnoses: CAD, gastritis, esophagitis, cholecystitis, pancreatitis  Plan: 1. Continue omeprazole 20 mg once daily for additional 1-2 weeks.  Consider increasing dose or changing PPI in future. 2. Labs today 3. Referral cardiology for workup given chest tightness and associated flushing/shortness of breath 4. Plan future GI referral if cardiac workup neg. 5. FOLLOW-UP approx 6-8 weeks - timing to allow cardiac workup.  Patient instructed to call sooner for GI referral if needed between visits.  Follow up plan: Return in about 2 months (around 11/13/2019) for  epigastric stomach pain.  Cassell Smiles, DNP, AGPCNP-BC Adult Gerontology Primary  Care Nurse Practitioner [facility] 09/13/2019, 8:59 AM

## 2019-09-14 LAB — COMPREHENSIVE METABOLIC PANEL
AG Ratio: 1.2 (calc) (ref 1.0–2.5)
ALT: 10 U/L (ref 9–46)
AST: 10 U/L (ref 10–40)
Albumin: 4.1 g/dL (ref 3.6–5.1)
Alkaline phosphatase (APISO): 69 U/L (ref 36–130)
BUN: 10 mg/dL (ref 7–25)
CO2: 31 mmol/L (ref 20–32)
Calcium: 9.5 mg/dL (ref 8.6–10.3)
Chloride: 101 mmol/L (ref 98–110)
Creat: 0.84 mg/dL (ref 0.60–1.35)
Globulin: 3.3 g/dL (calc) (ref 1.9–3.7)
Glucose, Bld: 104 mg/dL — ABNORMAL HIGH (ref 65–99)
Potassium: 3.9 mmol/L (ref 3.5–5.3)
Sodium: 138 mmol/L (ref 135–146)
Total Bilirubin: 0.5 mg/dL (ref 0.2–1.2)
Total Protein: 7.4 g/dL (ref 6.1–8.1)

## 2019-09-14 LAB — LIPID PANEL
Cholesterol: 123 mg/dL (ref <200)
HDL: 43 mg/dL (ref >=40)
LDL Cholesterol (Calc): 64 mg/dL (calc)
Non-HDL Cholesterol (Calc): 80 mg/dL (calc) (ref <130)
Total CHOL/HDL Ratio: 2.9 (calc) (ref <5.0)
Triglycerides: 75 mg/dL (ref <150)

## 2019-09-14 LAB — LIPASE: Lipase: 35 U/L (ref 7–60)

## 2019-09-15 ENCOUNTER — Encounter: Payer: Self-pay | Admitting: Nurse Practitioner

## 2019-09-17 ENCOUNTER — Encounter: Payer: Self-pay | Admitting: Cardiology

## 2019-09-17 ENCOUNTER — Ambulatory Visit (INDEPENDENT_AMBULATORY_CARE_PROVIDER_SITE_OTHER): Payer: BLUE CROSS/BLUE SHIELD | Admitting: Cardiology

## 2019-09-17 ENCOUNTER — Other Ambulatory Visit: Payer: Self-pay | Admitting: Nurse Practitioner

## 2019-09-17 ENCOUNTER — Other Ambulatory Visit: Payer: Self-pay

## 2019-09-17 VITALS — BP 96/76 | HR 70 | Ht 62.0 in | Wt 107.0 lb

## 2019-09-17 DIAGNOSIS — K2950 Unspecified chronic gastritis without bleeding: Secondary | ICD-10-CM

## 2019-09-17 DIAGNOSIS — R0789 Other chest pain: Secondary | ICD-10-CM | POA: Diagnosis not present

## 2019-09-17 DIAGNOSIS — R079 Chest pain, unspecified: Secondary | ICD-10-CM

## 2019-09-17 MED ORDER — PANTOPRAZOLE SODIUM 40 MG PO TBEC: 40.0000 mg | DELAYED_RELEASE_TABLET | Freq: Two times a day (BID) | ORAL | 5 refills | Status: DC

## 2019-09-17 NOTE — Patient Instructions (Signed)
Medication Instructions:  Your physician has recommended you make the following change in your medication:  1- STOP Prilosec. 2- START Protonix 40 mg (1 tablet) by mouth two times a day.  *If you need a refill on your cardiac medications before your next appointment, please call your pharmacy*  Lab Work: NONE If you have labs (blood work) drawn today and your tests are completely normal, you will receive your results only by: Marland Kitchen MyChart Message (if you have MyChart) OR . A paper copy in the mail If you have any lab test that is abnormal or we need to change your treatment, we will call you to review the results.  Testing/Procedures: NONE  Follow-Up: At [facility], you and your health needs are our priority.  As part of our continuing mission to provide you with exceptional heart care, we have created designated Provider Care Teams.  These Care Teams include your primary Cardiologist (physician) and Advanced Practice Providers (APPs -  Physician Assistants and Nurse Practitioners) who all work together to provide you with the care you need, when you need it.  Your next appointment:   1 month.  The format for your next appointment:   In Person  Provider:    You may see Kate Sable, MD or one of the following Advanced Practice Providers on your designated Care Team:    Murray Hodgkins, NP  Christell Faith, PA-C  Marrianne Mood, PA-C

## 2019-09-17 NOTE — Progress Notes (Signed)
Cardiology Office Note:    Date:  09/17/2019   ID:  Trevor Stanton, DOB 1976/07/27, MRN [id number]  PCP:  Mikey College, NP  Cardiologist:  Kate Sable, MD  Electrophysiologist:  None   Referring MD: Mikey College, *   Chief Complaint  Patient presents with  . New Patient (Initial Visit)    A little pain in chest today. Denies shortness of breath or dizziness. Denies swellng.    [place] interpreter used for the encounter, Novella Rob [id number] History of Present Illness:    Trevor Stanton is a 43 y.o. male with a hx of reflux who presents due to chest discomfort.  Patient states having abdominal pain for years now, usually epigastric and sometimes notices sharp pain radiating to both his right and left chest.  He denies shortness of breath but notices his heart racing fast whenever he has chest pain.  Pain is not related with exertion.  Pain is sometimes worse when he eats, and he was prescribed Prilosec 20 mg every day which he states reduces the pain somewhat.  Pain is persistently there, even in the office right now which she rates as a 2-3/10.  He denies hematuria, hematemesis, or blood in stool.  He is being scheduled to see a GI specialist.  He states smoking for about 3 to 4 years in the past.  Denies any family history of heart disease.  Past Medical History:  Diagnosis Date  . GERD (gastroesophageal reflux disease)     History reviewed. No pertinent surgical history.  Current Medications: Current Meds  Medication Sig  . bismuth subsalicylate (PEPTO BISMOL) 262 MG/15ML suspension Take 30 mLs by mouth every 6 (six) hours as needed.  . tamsulosin (FLOMAX) 0.4 MG CAPS capsule Take 1 capsule (0.4 mg total) by mouth daily.  . [DISCONTINUED] omeprazole (PRILOSEC) 20 MG capsule Take 1 capsule (20 mg total) by mouth daily.     Allergies:   Patient has no known allergies.   Social History   Socioeconomic History  . Marital status: Single    Spouse name: n/a  . Number  of children: 0  . Years of education: Not on file  . Highest education level: 9th grade  Occupational History  . Not on file  Social Needs  . Financial resource strain: Not on file  . Food insecurity    Worry: Not on file    Inability: Not on file  . Transportation needs    Medical: Not on file    Non-medical: Not on file  Tobacco Use  . Smoking status: Former Smoker    Packs/day: 0.50    Years: 5.00    Pack years: 2.50    Types: Cigarettes  . Smokeless tobacco: Former Systems developer    Quit date: 08/12/2016  Substance and Sexual Activity  . Alcohol use: Yes    Alcohol/week: 0.0 standard drinks    Frequency: Never    Comment: occasionally  . Drug use: Never  . Sexual activity: Not Currently  Lifestyle  . Physical activity    Days per week: Not on file    Minutes per session: Not on file  . Stress: Not on file  Relationships  . Social Herbalist on phone: Not on file    Gets together: Not on file    Attends religious service: Not on file    Active member of club or organization: Not on file    Attends meetings of clubs or organizations: Not on file  Relationship status: Not on file  Other Topics Concern  . Not on file  Social History Narrative   Works in a Company secretary     Family History: The patient's family history includes Diabetes in his mother; Hyperlipidemia in his mother; Hypertension in his mother.  ROS:   Please see the history of present illness.     All other systems reviewed and are negative.  EKGs/Labs/Other Studies Reviewed:    The following studies were reviewed today:   EKG:  EKG is  ordered today.  The ekg ordered today demonstrates sinus rhythm, normal ECG.  Recent Labs: 09/13/2019: ALT 10; BUN 10; Creat 0.84; Potassium 3.9; Sodium 138  Recent Lipid Panel    Component Value Date/Time   CHOL 123 09/13/2019 0920   TRIG 75 09/13/2019 0920   HDL 43 09/13/2019 0920   CHOLHDL 2.9 09/13/2019 0920   VLDL 16 02/16/2015 1648   LDLCALC 64  09/13/2019 0920    Physical Exam:    VS:  BP 96/76 (BP Location: Right Arm, Patient Position: Sitting, Cuff Size: Normal)   Pulse 70   Ht 5\' 2"  (1.575 m)   Wt 107 lb (48.5 kg)   BMI 19.57 kg/m     Wt Readings from Last 3 Encounters:  09/17/19 107 lb (48.5 kg)  09/13/19 108 lb 3.2 oz (49.1 kg)  08/13/19 108 lb 9.6 oz (49.3 kg)     GEN:  Well nourished, well developed in no acute distress HEENT: Normal NECK: No JVD; No carotid bruits LYMPHATICS: No lymphadenopathy CARDIAC: RRR, no murmurs, rubs, gallops RESPIRATORY:  Clear to auscultation without rales, wheezing or rhonchi  ABDOMEN: Soft, non-tender, non-distended MUSCULOSKELETAL:  No edema; No deformity  SKIN: Warm and dry NEUROLOGIC:  Alert and oriented x 3 PSYCHIATRIC:  Normal affect   ASSESSMENT:   Patient's symptoms of epigastric discomfort with sharp pain radiating to the chest are very atypical.  Symptoms likely GI related.  He has no risk factors, and I will place him a low risk for coronary artery disease.  1. Atypical chest pain    PLAN:    In order of problems listed above:  1. Start Protonix 40 mg twice daily.  Follow-up in 1 month.  Patient advised to keep appointment on seeing gastroenterology for further work-up.  If symptoms persist or become worse despite Protonix twice daily for a month, we will consider stress testing.  Total encounter time more than 45 minutes  Greater than 50% was spent in counseling and coordination of care with the patient  This note was generated in part or whole with voice recognition software. Voice recognition is usually quite accurate but there are transcription errors that can and very often do occur. I apologize for any typographical errors that were not detected and corrected.  Medication Adjustments/Labs and Tests Ordered: Current medicines are reviewed at length with the patient today.  Concerns regarding medicines are outlined above.  Orders Placed This Encounter   Procedures  . EKG 12-Lead   Meds ordered this encounter  Medications  . pantoprazole (PROTONIX) 40 MG tablet    Sig: Take 1 tablet (40 mg total) by mouth 2 (two) times daily.    Dispense:  60 tablet    Refill:  5    Patient Instructions  Medication Instructions:  Your physician has recommended you make the following change in your medication:  1- STOP Prilosec. 2- START Protonix 40 mg (1 tablet) by mouth two times a day.  *If you need  a refill on your cardiac medications before your next appointment, please call your pharmacy*  Lab Work: NONE If you have labs (blood work) drawn today and your tests are completely normal, you will receive your results only by: Marland Kitchen MyChart Message (if you have MyChart) OR . A paper copy in the mail If you have any lab test that is abnormal or we need to change your treatment, we will call you to review the results.  Testing/Procedures: NONE  Follow-Up: At [facility], you and your health needs are our priority.  As part of our continuing mission to provide you with exceptional heart care, we have created designated Provider Care Teams.  These Care Teams include your primary Cardiologist (physician) and Advanced Practice Providers (APPs -  Physician Assistants and Nurse Practitioners) who all work together to provide you with the care you need, when you need it.  Your next appointment:   1 month.  The format for your next appointment:   In Person  Provider:    You may see Kate Sable, MD or one of the following Advanced Practice Providers on your designated Care Team:    Murray Hodgkins, NP  Christell Faith, PA-C  Marrianne Mood, PA-C        Signed, Kate Sable, MD  09/17/2019 9:52 AM    [place]

## 2019-09-26 ENCOUNTER — Telehealth: Payer: Self-pay | Admitting: Nurse Practitioner

## 2019-09-26 DIAGNOSIS — K2950 Unspecified chronic gastritis without bleeding: Secondary | ICD-10-CM

## 2019-09-26 DIAGNOSIS — R1013 Epigastric pain: Secondary | ICD-10-CM

## 2019-09-26 NOTE — Telephone Encounter (Signed)
Please notify patient that referral was placed to [facility]   Referral to [facility] for evaluation of chronic worsening now gastritis vs GERD - epigastric pain, worse with eating, has had some atypical chest pain that was evaluated already by cardiology on 09/17/19, and they recommended next step as GI workup. Patient already on PPI and medication options from PCP, now requesting proceed with GI referral.  Nobie Putnam, DO [facility] 09/26/2019, 12:20 PM

## 2019-09-26 NOTE — Telephone Encounter (Signed)
Claiborne Billings stopped by today said that brother in law need a referral to [facility]

## 2019-09-27 NOTE — Telephone Encounter (Signed)
The pt brother was notified that the referral was placed.

## 2019-10-19 ENCOUNTER — Ambulatory Visit: Payer: BLUE CROSS/BLUE SHIELD | Admitting: Cardiology

## 2019-11-12 ENCOUNTER — Ambulatory Visit: Payer: Self-pay | Admitting: Nurse Practitioner

## 2019-11-15 ENCOUNTER — Ambulatory Visit: Payer: BLUE CROSS/BLUE SHIELD | Admitting: Gastroenterology

## 2019-12-05 ENCOUNTER — Other Ambulatory Visit: Payer: Self-pay | Admitting: Nurse Practitioner

## 2019-12-05 DIAGNOSIS — R3914 Feeling of incomplete bladder emptying: Secondary | ICD-10-CM

## 2019-12-05 DIAGNOSIS — K2950 Unspecified chronic gastritis without bleeding: Secondary | ICD-10-CM

## 2019-12-05 DIAGNOSIS — N401 Enlarged prostate with lower urinary tract symptoms: Secondary | ICD-10-CM

## 2019-12-07 ENCOUNTER — Other Ambulatory Visit: Payer: Self-pay

## 2019-12-12 ENCOUNTER — Ambulatory Visit (INDEPENDENT_AMBULATORY_CARE_PROVIDER_SITE_OTHER): Payer: BLUE CROSS/BLUE SHIELD | Admitting: Family Medicine

## 2019-12-12 ENCOUNTER — Other Ambulatory Visit: Payer: Self-pay

## 2019-12-12 ENCOUNTER — Encounter: Payer: Self-pay | Admitting: Family Medicine

## 2019-12-12 DIAGNOSIS — G8929 Other chronic pain: Secondary | ICD-10-CM

## 2019-12-12 DIAGNOSIS — M25562 Pain in left knee: Secondary | ICD-10-CM

## 2019-12-12 MED ORDER — NAPROXEN 500 MG PO TABS: 500.0000 mg | ORAL_TABLET | Freq: Two times a day (BID) | ORAL | 1 refills | Status: DC

## 2019-12-12 NOTE — Progress Notes (Signed)
Virtual Visit via Telephone The purpose of this virtual visit is to provide medical care while limiting exposure to the novel coronavirus (COVID19) for both patient and office staff.  Consent was obtained for phone visit:  Yes.   Answered questions that patient had about telehealth interaction:  Yes.   I discussed the limitations, risks, security and privacy concerns of performing an evaluation and management service by telephone. I also discussed with the patient that there may be a patient responsible charge related to this service. The patient expressed understanding and agreed to proceed.  Patient Location: Home Provider Location: [facility])  ---------------------------------------------------------------------- Chief Complaint  Patient presents with  . Knee Pain     The pt fell down and injured his kneex 1-2 yrs ago. Chronic intermittent left knee pain w/ swelling after prolong standing or movement. He describe it as a sharp pain that only happens when he's standing or walking .    Patient provided history in [place]. Interpreter. See nursing note.  S: Reviewed CMA documentation. I have called patient and gathered additional HPI as follows:  Chronic Left Knee Pain, swelling Reports that symptoms started 1-2 year ago prior fall injury on L knee, has had episodic chronic recurrent pain w/ left knee worse after prolong standing or movement, can be sharp pain as described with episodic swelling. - Tried heating pad occasionally. Not taking OTC medications.  Denies any high risk travel to areas of current concern for COVID19. Denies any known or suspected exposure to person with or possibly with COVID19.  Denies any fevers, chills, sweats, body ache, cough, shortness of breath, sinus pain or pressure, headache, abdominal pain, diarrhea  Past Medical History:  Diagnosis Date  . GERD (gastroesophageal reflux disease)    Social History   Tobacco Use  .  Smoking status: Former Smoker    Packs/day: 0.50    Years: 5.00    Pack years: 2.50    Types: Cigarettes  . Smokeless tobacco: Former Systems developer    Quit date: 08/12/2016  Substance Use Topics  . Alcohol use: Yes    Alcohol/week: 0.0 standard drinks    Comment: occasionally  . Drug use: Never    Current Outpatient Medications:  .  pantoprazole (PROTONIX) 40 MG tablet, Take 1 tablet (40 mg total) by mouth 2 (two) times daily., Disp: 60 tablet, Rfl: 5 .  naproxen (NAPROSYN) 500 MG tablet, Take 1 tablet (500 mg total) by mouth 2 (two) times daily with a meal. For 1-2 weeks then as needed, Disp: 60 tablet, Rfl: 1  Depression screen [facility] 2/9 08/13/2019  Decreased Interest 0  Down, Depressed, Hopeless 0  PHQ - 2 Score 0    No flowsheet data found.  -------------------------------------------------------------------------- O: No physical exam performed due to remote telephone encounter.  Lab results reviewed.  No results found for this or any previous visit (from the past 2160 hour(s)).  -------------------------------------------------------------------------- A&P:  Problem List Items Addressed This Visit    None    Visit Diagnoses    Chronic pain of left knee    -  Primary   Relevant Medications   naproxen (NAPROSYN) 500 MG tablet   Other Relevant Orders   Ambulatory referral to Orthopedic Surgery     Subacute on chronic L generalized Knee pain and swelling with known prior history fall injury 1-2 year ago Old chronic injury, limited work up after injury Persistent episodic pain and swelling Cannot rule out meniscus injury or other problem. - No prior  history of knee surgery, arthroscopy - Inadequate conservative therapy   Plan: 1. Start anti-inflammatory trial with rx Naprosyn 500mg  BID wc x 2-4 weeks, then PRN 2. Start Tylenol 500-1000mg  per dose TID PRN breakthrough 3. RICE therapy (rest, ice, compression, elevation) for swelling, activity modification 4. Referral to  [facility] given patient request after this problem for >1 year and not improving, he will need imaging, and agree that Orthopedic will be able to do x-ray and evaluation at once.  Orders Placed This Encounter  Procedures  . Ambulatory referral to Orthopedic Surgery    Referral Priority:   Routine    Referral Type:   Surgical    Referral Reason:   Specialty Services Required    Requested Specialty:   Orthopedic Surgery    Number of Visits Requested:   1     Meds ordered this encounter  Medications  . naproxen (NAPROSYN) 500 MG tablet    Sig: Take 1 tablet (500 mg total) by mouth 2 (two) times daily with a meal. For 1-2 weeks then as needed    Dispense:  60 tablet    Refill:  1    Follow-up: - Return as needed  Patient verbalizes understanding with the above medical recommendations including the limitation of remote medical advice.  Specific follow-up and call-back criteria were given for patient to follow-up or seek medical care more urgently if needed.   - Time spent in direct consultation with patient on phone: 9 minutes   Nobie Putnam, [facility] 12/12/2019, 9:32 AM

## 2019-12-12 NOTE — Progress Notes (Signed)
Interpreter [place], # [phone]

## 2019-12-12 NOTE — Patient Instructions (Addendum)
  [facility] [address], [place]  [address] Phone: [phone]  Please schedule a Follow-up Appointment to: Return if symptoms worsen or fail to improve.  If you have any other questions or concerns, please feel free to call the office or send a message through [place]. You may also schedule an earlier appointment if necessary.  Additionally, you may be receiving a survey about your experience at our office within a few days to 1 week by e-mail or mail. We value your feedback.  Nobie Putnam, DO [facility]

## 2020-01-23 ENCOUNTER — Ambulatory Visit: Payer: 59 | Admitting: Gastroenterology

## 2020-01-23 ENCOUNTER — Other Ambulatory Visit: Payer: Self-pay

## 2020-01-23 VITALS — BP 122/82 | HR 83 | Temp 98.1°F | Ht 63.0 in | Wt 113.6 lb

## 2020-01-23 DIAGNOSIS — Z791 Long term (current) use of non-steroidal anti-inflammatories (NSAID): Secondary | ICD-10-CM | POA: Diagnosis not present

## 2020-01-23 DIAGNOSIS — K5909 Other constipation: Secondary | ICD-10-CM | POA: Diagnosis not present

## 2020-01-23 DIAGNOSIS — R109 Unspecified abdominal pain: Secondary | ICD-10-CM | POA: Diagnosis not present

## 2020-01-23 NOTE — Patient Instructions (Signed)
STOP taking the Naproxen

## 2020-01-23 NOTE — Progress Notes (Signed)
Trevor Bellows MD, MRCP(U.K) [address]  [place]  [place], [place] [address]  Main: [phone]  Fax: [phone]   Gastroenterology Consultation  Referring Provider:     Mikey College, * Primary Care Physician:  Mikey College, NP (Inactive) Primary Gastroenterologist:  Dr. Jonathon Stanton  Reason for Consultation:     Gastritis versus GERD versus abdominal pain        HPI:   Trevor Stanton is a 44 y.o. y/o male referred for abdominal pain.  In October 2020 she was seen by his  cardiologist for atypical chest pain.  Commenced on Prilosec 40 mg twice a day and advised to undergo gastroenterology evaluation and subsequent follow-up with cardiology.   October 2020: CMP normal lipase normal.  He is here today with an interpreter he speaks only [place].  Been in the [place] over 20 years.  He states that for over 2 years he has had generalized abdominal pain which last throughout the day and has not really changed over the past 2 years.  No clear aggravating factors but relieved after a good bowel movement.  He has a bowel movement once in 3 days and it is hard and nature.  No family history of colon polyps or colon cancer.  He takes naproxen daily for knee pain which has been long-term.  Says he has lost about 4 to 5 pounds of weight over the past 1 year.  He has been on Prilosec 40 mg twice a day with no significant improvement with the pain.  No prior endoscopic evaluation.  Was challenging to obtain a history from him despite the interpreter.  Probably a different dialect.  Past Medical History:  Diagnosis Date  . GERD (gastroesophageal reflux disease)     No past surgical history on file.  Prior to Admission medications   Medication Sig Start Date End Date Taking? Authorizing Provider  naproxen (NAPROSYN) 500 MG tablet Take 1 tablet (500 mg total) by mouth 2 (two) times daily with a meal. For 1-2 weeks then as needed 12/12/19   Parks Ranger, Devonne Doughty, DO    pantoprazole (PROTONIX) 40 MG tablet Take 1 tablet (40 mg total) by mouth 2 (two) times daily. 09/17/19   Kate Sable, MD    Family History  Problem Relation Age of Onset  . Diabetes Mother   . Hypertension Mother   . Hyperlipidemia Mother      Social History   Tobacco Use  . Smoking status: Former Smoker    Packs/day: 0.50    Years: 5.00    Pack years: 2.50    Types: Cigarettes  . Smokeless tobacco: Former Systems developer    Quit date: 08/12/2016  Substance Use Topics  . Alcohol use: Yes    Alcohol/week: 0.0 standard drinks    Comment: occasionally  . Drug use: Never    Allergies as of 01/23/2020  . (No Known Allergies)    Review of Systems:    All systems reviewed and negative except where noted in HPI.   Physical Exam:  There were no vitals taken for this visit. No LMP for male patient. Psych:  Alert and cooperative. Normal mood and affect. General:   Alert,  Well-developed, well-nourished, pleasant and cooperative in NAD Head:  Normocephalic and atraumatic. Eyes:  Sclera clear, no icterus.   Conjunctiva pink. Ears:  Normal auditory acuity. Lungs:  Respirations even and unlabored.  Clear throughout to auscultation.   No wheezes, crackles, or rhonchi. No acute distress. Heart:  Regular rate and rhythm; no murmurs, clicks, rubs, or gallops. Abdomen:  Normal bowel sounds.  No bruits.  Soft, non-tender and non-distended without masses, hepatosplenomegaly or hernias noted.  No guarding or rebound tenderness.    Neurologic:  Alert and oriented x3;  grossly normal neurologically. Skin:  Intact without significant lesions or rashes. No jaundice. Lymph Nodes:  No significant cervical adenopathy. Psych:  Alert and cooperative. Normal mood and affect.  Imaging Studies: No results found.  Assessment and Plan:   Trevor Stanton is a 44 y.o. y/o male has been referred for abdominal pain of over 2 years duration.  Not responded to a trial of PPI.  Some weight loss over the past 1  year.  Long-term use of NSAIDs.  Also has constipation which is relatively new in onset.  Plan 1.  Check CBC, TSH, H. pylori breath test 2.  EGD plus colonoscopy to be done for abdominal pain and constipation 3.  Commence on MiraLAX 1 capful daily. 4.  If above evaluation does not help or does not produce resolution of the problem then would consider CT scan of the abdomen next visit.  I have discussed alternative options, risks & benefits,  which include, but are not limited to, bleeding, infection, perforation,respiratory complication & drug reaction.  The patient agrees with this plan & written consent will be obtained.    An interpreter was used throughout the visit.  Follow up in 8 to 12 weeks  Dr Trevor Bellows MD,MRCP(U.K)

## 2020-01-24 ENCOUNTER — Encounter: Payer: Self-pay | Admitting: Gastroenterology

## 2020-01-24 LAB — CBC WITH DIFFERENTIAL
Basophils Absolute: 0.1 10*3/uL (ref 0.0–0.2)
Basos: 1 %
EOS (ABSOLUTE): 0.2 10*3/uL (ref 0.0–0.4)
Eos: 2 %
Hematocrit: 36.1 % — ABNORMAL LOW (ref 37.5–51.0)
Hemoglobin: 12.4 g/dL — ABNORMAL LOW (ref 13.0–17.7)
Immature Grans (Abs): 0 10*3/uL (ref 0.0–0.1)
Immature Granulocytes: 0 %
Lymphocytes Absolute: 1.6 10*3/uL (ref 0.7–3.1)
Lymphs: 26 %
MCH: 29.7 pg (ref 26.6–33.0)
MCHC: 34.3 g/dL (ref 31.5–35.7)
MCV: 86 fL (ref 79–97)
Monocytes Absolute: 0.5 10*3/uL (ref 0.1–0.9)
Monocytes: 7 %
Neutrophils Absolute: 3.9 10*3/uL (ref 1.4–7.0)
Neutrophils: 64 %
RBC: 4.18 x10E6/uL (ref 4.14–5.80)
RDW: 12.7 % (ref 11.6–15.4)
WBC: 6.2 10*3/uL (ref 3.4–10.8)

## 2020-01-24 LAB — TSH: TSH: 4.27 u[IU]/mL (ref 0.450–4.500)

## 2020-01-25 LAB — H. PYLORI BREATH TEST: H pylori Breath Test: NEGATIVE

## 2020-02-08 ENCOUNTER — Other Ambulatory Visit: Payer: Self-pay | Admitting: Family Medicine

## 2020-02-08 DIAGNOSIS — G8929 Other chronic pain: Secondary | ICD-10-CM

## 2020-02-14 ENCOUNTER — Other Ambulatory Visit
Admission: RE | Admit: 2020-02-14 | Discharge: 2020-02-14 | Disposition: A | Discharge status: Home / Self Care | Payer: 59 | Source: Ambulatory Visit | Attending: Gastroenterology | Admitting: Gastroenterology

## 2020-02-14 DIAGNOSIS — Z01812 Encounter for preprocedural laboratory examination: Secondary | ICD-10-CM | POA: Diagnosis not present

## 2020-02-14 DIAGNOSIS — Z20822 Contact with and (suspected) exposure to covid-19: Secondary | ICD-10-CM | POA: Diagnosis not present

## 2020-02-14 LAB — SARS CORONAVIRUS 2 (TAT 6-24 HRS): SARS Coronavirus 2: NEGATIVE

## 2020-02-18 ENCOUNTER — Ambulatory Visit
Admission: RE | Admit: 2020-02-18 | Discharge: 2020-02-18 | Disposition: A | Discharge status: Home / Self Care | Payer: 59 | Attending: Gastroenterology | Admitting: Gastroenterology

## 2020-02-18 ENCOUNTER — Encounter: Payer: Self-pay | Admitting: Gastroenterology

## 2020-02-18 ENCOUNTER — Encounter
Admission: RE | Disposition: A | Discharge status: Home / Self Care | Payer: Self-pay | Source: Home / Self Care | Attending: Gastroenterology

## 2020-02-18 ENCOUNTER — Ambulatory Visit: Payer: 59 | Admitting: Anesthesiology

## 2020-02-18 DIAGNOSIS — Z87891 Personal history of nicotine dependence: Secondary | ICD-10-CM | POA: Diagnosis not present

## 2020-02-18 DIAGNOSIS — K219 Gastro-esophageal reflux disease without esophagitis: Secondary | ICD-10-CM | POA: Insufficient documentation

## 2020-02-18 DIAGNOSIS — K5900 Constipation, unspecified: Secondary | ICD-10-CM | POA: Insufficient documentation

## 2020-02-18 DIAGNOSIS — K5909 Other constipation: Secondary | ICD-10-CM

## 2020-02-18 DIAGNOSIS — Z79899 Other long term (current) drug therapy: Secondary | ICD-10-CM | POA: Diagnosis not present

## 2020-02-18 DIAGNOSIS — R109 Unspecified abdominal pain: Secondary | ICD-10-CM | POA: Insufficient documentation

## 2020-02-18 DIAGNOSIS — D122 Benign neoplasm of ascending colon: Secondary | ICD-10-CM | POA: Insufficient documentation

## 2020-02-18 DIAGNOSIS — Z791 Long term (current) use of non-steroidal anti-inflammatories (NSAID): Secondary | ICD-10-CM | POA: Diagnosis not present

## 2020-02-18 HISTORY — PX: COLONOSCOPY WITH PROPOFOL: SHX5780

## 2020-02-18 HISTORY — PX: ESOPHAGOGASTRODUODENOSCOPY (EGD) WITH PROPOFOL: SHX5813

## 2020-02-18 SURGERY — COLONOSCOPY WITH PROPOFOL
Anesthesia: General

## 2020-02-18 MED ORDER — MIDAZOLAM HCL 2 MG/2ML IJ SOLN
INTRAMUSCULAR | Status: DC | PRN
  Administered 2020-02-18: 2 mg via INTRAVENOUS

## 2020-02-18 MED ORDER — SODIUM CHLORIDE 0.9 % IV SOLN
INTRAVENOUS | Status: DC
  Administered 2020-02-18: 1000 mL via INTRAVENOUS

## 2020-02-18 MED ORDER — PROPOFOL 10 MG/ML IV BOLUS
INTRAVENOUS | Status: DC | PRN
  Administered 2020-02-18: 20 mg via INTRAVENOUS
  Administered 2020-02-18: 50 mg via INTRAVENOUS

## 2020-02-18 MED ORDER — PROPOFOL 500 MG/50ML IV EMUL
INTRAVENOUS | Status: AC
  Filled 2020-02-18: qty 50

## 2020-02-18 MED ORDER — SODIUM CHLORIDE 0.9 % IV SOLN: INTRAVENOUS | Status: DC | PRN

## 2020-02-18 MED ORDER — LIDOCAINE HCL (CARDIAC) PF 100 MG/5ML IV SOSY
PREFILLED_SYRINGE | INTRAVENOUS | Status: DC | PRN
  Administered 2020-02-18: 60 mg via INTRAVENOUS

## 2020-02-18 MED ORDER — PROPOFOL 500 MG/50ML IV EMUL
INTRAVENOUS | Status: DC | PRN
  Administered 2020-02-18: 125 ug/kg/min via INTRAVENOUS

## 2020-02-18 MED ORDER — MIDAZOLAM HCL 2 MG/2ML IJ SOLN
INTRAMUSCULAR | Status: AC
  Filled 2020-02-18: qty 2

## 2020-02-18 NOTE — Transfer of Care (Signed)
Immediate Anesthesia Transfer of Care Note  Patient: Trevor Stanton  Procedure(s) Performed: COLONOSCOPY WITH PROPOFOL (N/A ) ESOPHAGOGASTRODUODENOSCOPY (EGD) WITH PROPOFOL (N/A )  Patient Location: PACU  Anesthesia Type:General  Level of Consciousness: drowsy  Airway & Oxygen Therapy: Patient Spontanous Breathing and Patient connected to face mask oxygen  Post-op Assessment: Report given to RN and Post -op Vital signs reviewed and stable  Post vital signs: Reviewed and stable  Last Vitals:  Vitals Value Taken Time  BP    Temp    Pulse    Resp    SpO2      Last Pain:  Vitals:   02/18/20 0902  TempSrc: Tympanic  PainSc: 0-No pain         Complications: No apparent anesthesia complications

## 2020-02-18 NOTE — Anesthesia Postprocedure Evaluation (Signed)
Anesthesia Post Note  Patient: Trevor Stanton  Procedure(s) Performed: COLONOSCOPY WITH PROPOFOL (N/A ) ESOPHAGOGASTRODUODENOSCOPY (EGD) WITH PROPOFOL (N/A )  Patient location during evaluation: Endoscopy Anesthesia Type: General Level of consciousness: awake and alert Pain management: pain level controlled Vital Signs Assessment: post-procedure vital signs reviewed and stable Respiratory status: spontaneous breathing, nonlabored ventilation, respiratory function stable and patient connected to nasal cannula oxygen Cardiovascular status: blood pressure returned to baseline and stable Postop Assessment: no apparent nausea or vomiting Anesthetic complications: no     Last Vitals:  Vitals:   02/18/20 0902 02/18/20 1007  BP: (!) 135/91 112/74  Pulse: 74 85  Resp: 16 15  Temp: (!) 36.2 C   SpO2: 100% 100%    Last Pain:  Vitals:   02/18/20 1017  TempSrc:   PainSc: (P) 0-No pain                 Arita Miss

## 2020-02-18 NOTE — Op Note (Signed)
[facility] Gastroenterology Patient Name: Trevor Stanton Procedure Date: 02/18/2020 9:34 AM MRN: [id number] Account #: [id number] Date of Birth: November 13, 1976 Admit Type: Outpatient Age: 44 Room: [facility] ENDO ROOM 4 Gender: Male Note Status: Finalized Procedure:             Colonoscopy Indications:           Constipation Providers:             Jonathon Bellows MD, MD Referring MD:          Mikey [employer] (Referring MD) Medicines:             Monitored Anesthesia Care Complications:         No immediate complications. Procedure:             Pre-Anesthesia Assessment:                        - Prior to the procedure, a History and Physical was                         performed, and patient medications, allergies and                         sensitivities were reviewed. The patient's tolerance                         of previous anesthesia was reviewed.                        - The risks and benefits of the procedure and the                         sedation options and risks were discussed with the                         patient. All questions were answered and informed                         consent was obtained.                        - ASA Grade Assessment: II - A patient with mild                         systemic disease.                        After obtaining informed consent, the colonoscope was                         passed under direct vision. Throughout the procedure,                         the patient's blood pressure, pulse, and oxygen                         saturations were monitored continuously. The                         Colonoscope was introduced through the anus and  advanced to the the cecum, identified by the                         appendiceal orifice. The colonoscopy was performed                         with ease. The patient tolerated the procedure well.                         The quality of the bowel preparation was  excellent. Findings:      The perianal and digital rectal examinations were normal.      A 3 mm polyp was found in the proximal ascending colon. The polyp was       sessile. The polyp was removed with a cold biopsy forceps. Resection and       retrieval were complete.      The exam was otherwise without abnormality on direct and retroflexion       views. Impression:            - One 3 mm polyp in the proximal ascending colon,                         removed with a cold biopsy forceps. Resected and                         retrieved.                        - The examination was otherwise normal on direct and                         retroflexion views. Recommendation:        - Discharge patient to home (with escort).                        - Resume previous diet.                        - Continue present medications.                        - Await pathology results.                        - Repeat colonoscopy for surveillance based on                         pathology results.                        - Return to my office as previously scheduled. Procedure Code(s):     --- Professional ---                        [phone], Colonoscopy, flexible; with biopsy, single or                         multiple Diagnosis Code(s):     --- Professional ---  K63.5, Polyp of colon                        K59.00, Constipation, unspecified CPT copyright 2019 American Medical Association. All rights reserved. The codes documented in this report are preliminary and upon coder review may  be revised to meet current compliance requirements. Jonathon Bellows, MD Jonathon Bellows MD, MD 02/18/2020 10:04:25 AM This report has been signed electronically. Number of Addenda: 0 Note Initiated On: 02/18/2020 9:34 AM Scope Withdrawal Time: 0 hours 12 minutes 33 seconds  Total Procedure Duration: 0 hours 15 minutes 44 seconds  Estimated Blood Loss:  Estimated blood loss: none.      [facility]

## 2020-02-18 NOTE — Op Note (Signed)
[facility] Gastroenterology Patient Name: Trevor Stanton Procedure Date: 02/18/2020 9:35 AM MRN: [id number] Account #: [id number] Date of Birth: 1976-10-10 Admit Type: Outpatient Age: 44 Room: [facility] ENDO ROOM 4 Gender: Male Note Status: Finalized Procedure:             Upper GI endoscopy Indications:           Abdominal pain Providers:             Jonathon Bellows MD, MD Referring MD:          Mikey [employer] (Referring MD) Medicines:             Monitored Anesthesia Care Complications:         No immediate complications. Procedure:             Pre-Anesthesia Assessment:                        - Prior to the procedure, a History and Physical was                         performed, and patient medications, allergies and                         sensitivities were reviewed. The patient's tolerance                         of previous anesthesia was reviewed.                        - The risks and benefits of the procedure and the                         sedation options and risks were discussed with the                         patient. All questions were answered and informed                         consent was obtained.                        - ASA Grade Assessment: II - A patient with mild                         systemic disease.                        After obtaining informed consent, the endoscope was                         passed under direct vision. Throughout the procedure,                         the patient's blood pressure, pulse, and oxygen                         saturations were monitored continuously. The Endoscope                         was introduced through the mouth, and advanced  to the                         third part of duodenum. The upper GI endoscopy was                         accomplished with ease. The patient tolerated the                         procedure well. Findings:      The esophagus was normal.      The examined duodenum was normal.      The entire examined stomach was normal. Biopsies were taken with a cold       forceps for histology.      The cardia and gastric fundus were normal on retroflexion. Impression:            - Normal esophagus.                        - Normal examined duodenum.                        - Normal stomach. Biopsied. Recommendation:        - Await pathology results.                        - Perform a colonoscopy today. Procedure Code(s):     --- Professional ---                        [phone], Esophagogastroduodenoscopy, flexible,                         transoral; with biopsy, single or multiple Diagnosis Code(s):     --- Professional ---                        R10.9, Unspecified abdominal pain CPT copyright 2019 American Medical Association. All rights reserved. The codes documented in this report are preliminary and upon coder review may  be revised to meet current compliance requirements. Jonathon Bellows, MD Jonathon Bellows MD, MD 02/18/2020 9:46:29 AM This report has been signed electronically. Number of Addenda: 0 Note Initiated On: 02/18/2020 9:35 AM Estimated Blood Loss:  Estimated blood loss: none.      [facility]

## 2020-02-18 NOTE — Anesthesia Preprocedure Evaluation (Signed)
Anesthesia Evaluation  Patient identified by MRN, date of birth, ID band Patient awake    Reviewed: Allergy & Precautions, NPO status , Patient's Chart, lab work & pertinent test results  History of Anesthesia Complications Negative for: history of anesthetic complications  Airway Mallampati: III  TM Distance: >3 FB Neck ROM: Full    Dental  (+) Upper Dentures, Teeth Intact   Pulmonary neg pulmonary ROS, neg sleep apnea, neg COPD, Patient abstained from smoking.Not current smoker, former smoker,    Pulmonary exam normal breath sounds clear to auscultation       Cardiovascular Exercise Tolerance: Good METS(-) hypertension(-) CAD and (-) Past MI negative cardio ROS  (-) dysrhythmias  Rhythm:Regular Rate:Normal - Systolic murmurs    Neuro/Psych negative neurological ROS  negative psych ROS   GI/Hepatic GERD  Medicated,(+)     (-) substance abuse  ,   Endo/Other  neg diabetes  Renal/GU negative Renal ROS     Musculoskeletal   Abdominal   Peds  Hematology   Anesthesia Other Findings Past Medical History: No date: GERD (gastroesophageal reflux disease)  Reproductive/Obstetrics                             Anesthesia Physical Anesthesia Plan  ASA: II  Anesthesia Plan: General   Post-op Pain Management:    Induction: Intravenous  PONV Risk Score and Plan: 2 and Ondansetron, Propofol infusion and TIVA  Airway Management Planned: Nasal Cannula  Additional Equipment: None  Intra-op Plan:   Post-operative Plan:   Informed Consent: I have reviewed the patients History and Physical, chart, labs and discussed the procedure including the risks, benefits and alternatives for the proposed anesthesia with the patient or authorized representative who has indicated his/her understanding and acceptance.     Dental advisory given  Plan Discussed with: CRNA and Surgeon  Anesthesia Plan  Comments: (Discussed risks of anesthesia with patient, including possibility of difficulty with spontaneous ventilation under anesthesia necessitating airway intervention, PONV, and rare risks such as cardiac or respiratory or neurological events. Patient understands.  Interpreter vietnamese # [phone] utilized.)        Anesthesia Quick Evaluation

## 2020-02-18 NOTE — H&P (Signed)
Jonathon Bellows, MD [address] [place], [place], [place], [address] [address], [place], [place], [place], [address] Phone: [phone]  Fax: [phone]  Primary Care Physician:  Mikey College, NP (Inactive)   Pre-Procedure History & Physical: HPI:  Trevor Stanton is a 44 y.o. male is here for an endoscopy and colonoscopy    Past Medical History:  Diagnosis Date  . GERD (gastroesophageal reflux disease)     History reviewed. No pertinent surgical history.  Prior to Admission medications   Medication Sig Start Date End Date Taking? Authorizing Provider  naproxen (NAPROSYN) 500 MG tablet TAKE 1 TABLET(500 MG) BY MOUTH TWICE DAILY WITH A MEAL FOR 1 TO 2 WEEKS THEN AS NEEDED 02/08/20   Parks Ranger, Devonne Doughty, DO  pantoprazole (PROTONIX) 40 MG tablet Take 1 tablet (40 mg total) by mouth 2 (two) times daily. 09/17/19   Kate Sable, MD    Allergies as of 01/23/2020  . (No Known Allergies)    Family History  Problem Relation Age of Onset  . Diabetes Mother   . Hypertension Mother   . Hyperlipidemia Mother     Social History   Socioeconomic History  . Marital status: Single    Spouse name: n/a  . Number of children: 0  . Years of education: Not on file  . Highest education level: 9th grade  Occupational History  . Not on file  Tobacco Use  . Smoking status: Former Smoker    Packs/day: 0.50    Years: 5.00    Pack years: 2.50    Types: Cigarettes  . Smokeless tobacco: Former Systems developer    Quit date: 08/12/2016  Substance and Sexual Activity  . Alcohol use: Yes    Alcohol/week: 0.0 standard drinks    Comment: occasionally  . Drug use: Never  . Sexual activity: Not Currently  Other Topics Concern  . Not on file  Social History Narrative   Works in a Company secretary   Social Determinants of Health   Financial Resource Strain:   . Difficulty of Paying Living Expenses:   Food Insecurity:   . Worried About Charity fundraiser in the Last Year:   . Arts development officer in the Last Year:   Transportation Needs:   . Film/video editor (Medical):   Marland Kitchen Lack of Transportation (Non-Medical):   Physical Activity:   . Days of Exercise per Week:   . Minutes of Exercise per Session:   Stress:   . Feeling of Stress :   Social Connections:   . Frequency of Communication with Friends and Family:   . Frequency of Social Gatherings with Friends and Family:   . Attends Religious Services:   . Active Member of Clubs or Organizations:   . Attends Archivist Meetings:   Marland Kitchen Marital Status:   Intimate Partner Violence:   . Fear of Current or Ex-Partner:   . Emotionally Abused:   Marland Kitchen Physically Abused:   . Sexually Abused:     Review of Systems: See HPI, otherwise negative ROS  Physical Exam: BP (!) 135/91   Pulse 74   Temp (!) 97.1 F (36.2 C) (Tympanic)   Resp 16   Ht 5\' 3"  (1.6 m)   Wt 51.7 kg   SpO2 100%   BMI 20.19 kg/m  General:   Alert,  pleasant and cooperative in NAD Head:  Normocephalic and atraumatic. Neck:  Supple; no masses or thyromegaly. Lungs:  Clear throughout to  auscultation, normal respiratory effort.    Heart:  +S1, +S2, Regular rate and rhythm, No edema. Abdomen:  Soft, nontender and nondistended. Normal bowel sounds, without guarding, and without rebound.   Neurologic:  Alert and  oriented x4;  grossly normal neurologically.  Impression/Plan: Trevor Stanton is here for an endoscopy and colonoscopy  to be performed for  evaluation of constipation and abdominal pain    Risks, benefits, limitations, and alternatives regarding endoscopy have been reviewed with the patient.  Questions have been answered.  All parties agreeable.   Jonathon Bellows, MD  02/18/2020, 9:24 AM

## 2020-02-19 ENCOUNTER — Encounter: Payer: Self-pay | Admitting: *Deleted

## 2020-02-20 LAB — SURGICAL PATHOLOGY

## 2020-02-24 ENCOUNTER — Encounter: Payer: Self-pay | Admitting: Gastroenterology

## 2020-03-19 ENCOUNTER — Telehealth: Payer: Self-pay

## 2020-03-19 NOTE — Telephone Encounter (Signed)
Spoke with patient's sister in law to reschedule F/U appointment with Dr. Garen Lah. She states his insurance is no longer in network with [place] so patient will not be returning to our office. He will call PCP for refill of pantoprazole.

## 2020-03-31 ENCOUNTER — Encounter: Payer: Self-pay | Admitting: Emergency Medicine

## 2020-03-31 ENCOUNTER — Other Ambulatory Visit: Payer: Self-pay

## 2020-03-31 ENCOUNTER — Emergency Department (INDEPENDENT_AMBULATORY_CARE_PROVIDER_SITE_OTHER): Payer: 59

## 2020-03-31 ENCOUNTER — Emergency Department (INDEPENDENT_AMBULATORY_CARE_PROVIDER_SITE_OTHER)
Admission: EM | Admit: 2020-03-31 | Discharge: 2020-03-31 | Disposition: A | Discharge status: Other Acute Inpatient Hospital | Payer: 59 | Source: Home / Self Care | Attending: Family Medicine | Admitting: Family Medicine

## 2020-03-31 ENCOUNTER — Emergency Department: Payer: 59

## 2020-03-31 DIAGNOSIS — N1330 Unspecified hydronephrosis: Secondary | ICD-10-CM

## 2020-03-31 DIAGNOSIS — M79651 Pain in right thigh: Secondary | ICD-10-CM

## 2020-03-31 DIAGNOSIS — I82221 Chronic embolism and thrombosis of inferior vena cava: Secondary | ICD-10-CM | POA: Diagnosis not present

## 2020-03-31 DIAGNOSIS — N1339 Other hydronephrosis: Secondary | ICD-10-CM | POA: Diagnosis not present

## 2020-03-31 DIAGNOSIS — I82421 Acute embolism and thrombosis of right iliac vein: Secondary | ICD-10-CM

## 2020-03-31 DIAGNOSIS — R599 Enlarged lymph nodes, unspecified: Secondary | ICD-10-CM | POA: Diagnosis not present

## 2020-03-31 DIAGNOSIS — R590 Localized enlarged lymph nodes: Secondary | ICD-10-CM

## 2020-03-31 HISTORY — DX: Unspecified abdominal pain: R10.9

## 2020-03-31 MED ORDER — HEPARIN SOD (PORCINE) IN D5W 100 UNIT/ML IV SOLN
80.00 | INTRAVENOUS | Status: DC
Start: ? — End: 2020-03-31

## 2020-03-31 MED ORDER — IOHEXOL 300 MG/ML  SOLN
100.0000 mL | Freq: Once | INTRAMUSCULAR | Status: AC | PRN
  Administered 2020-03-31: 100 mL via INTRAVENOUS

## 2020-03-31 MED ORDER — HEPARIN SOD (PORCINE) IN D5W 100 UNIT/ML IV SOLN
40.00 | INTRAVENOUS | Status: DC
Start: ? — End: 2020-03-31

## 2020-03-31 MED ORDER — HEPARIN SOD (PORCINE) IN D5W 100 UNIT/ML IV SOLN
5.00 | INTRAVENOUS | Status: DC
Start: ? — End: 2020-03-31

## 2020-03-31 NOTE — ED Notes (Signed)
Family updated as to patient's status by Dr Assunta Found via phone.  Pt to go straight to hospital from [facility] as directed by Dr Assunta Found. Pt will go via private vehicle - stated his father-in-law drove him to UC.

## 2020-03-31 NOTE — ED Notes (Signed)
AMN -vietnamese interpretor [facility] Vu) # [phone]

## 2020-03-31 NOTE — ED Provider Notes (Signed)
[facility] C[facility]    CSN: [id number] Arrival date & time: 03/31/20  0851      History   Chief Complaint Chief Complaint  Patient presents with  . Leg Pain    Right inner thigh x 3 weeks    HPI Trevor Stanton is a 44 y.o. male.   Patient is [place].  Interview was facilitated by an interpretor. Patient reports that he had his second COVID19 vaccination about 4 weeks ago.  After that he began to notice pain/swelling in his right anterior/medial thigh that radiates to his right groin and right lower abdomen.  He feels well otherwise and denies fevers, chills, and sweats.  He denies other adenopathy.  The pain has become worse and has not improved with application of a medicated oil and an antibiotic that his mother gave him. Discussion with patient's stepfather (by phone) reveals that he has not been feeling well for several months.  The history is provided by the patient. The history is limited by a language barrier. A language interpreter was used.  Leg Pain Lower extremity pain location: right thigh. Time since incident:  3 weeks Injury: no   Pain details:    Quality:  Aching and dull   Radiates to:  Groin and RLQ   Severity:  Moderate   Onset quality:  Gradual   Duration:  3 weeks   Timing:  Constant   Progression:  Worsening Chronicity:  New Prior injury to area:  No Relieved by:  Nothing Worsened by:  Activity Associated symptoms: swelling   Associated symptoms: no back pain, no decreased ROM, no fatigue, no fever, no muscle weakness, no numbness, no stiffness and no tingling     Past Medical History:  Diagnosis Date  . Abdominal pain   . GERD (gastroesophageal reflux disease)     There are no problems to display for this patient.   Past Surgical History:  Procedure Laterality Date  . COLONOSCOPY WITH PROPOFOL N/A 02/18/2020   Procedure: COLONOSCOPY WITH PROPOFOL;  Surgeon: Jonathon Bellows, MD;  Location: [facility] ENDOSCOPY;  Service: Gastroenterology;   Laterality: N/A;  . ESOPHAGOGASTRODUODENOSCOPY (EGD) WITH PROPOFOL N/A 02/18/2020   Procedure: ESOPHAGOGASTRODUODENOSCOPY (EGD) WITH PROPOFOL;  Surgeon: Jonathon Bellows, MD;  Location: [facility] ENDOSCOPY;  Service: Gastroenterology;  Laterality: N/A;       Home Medications    Prior to Admission medications   Medication Sig Start Date End Date Taking? Authorizing Provider  naproxen (NAPROSYN) 500 MG tablet TAKE 1 TABLET(500 MG) BY MOUTH TWICE DAILY WITH A MEAL FOR 1 TO 2 WEEKS THEN AS NEEDED 02/08/20  Yes Karamalegos, Devonne Doughty, DO  pantoprazole (PROTONIX) 40 MG tablet Take 1 tablet (40 mg total) by mouth 2 (two) times daily. 09/17/19  Yes Agbor-Etang, Aaron Edelman, MD  tamsulosin (FLOMAX) 0.4 MG CAPS capsule Take 0.4 mg by mouth daily. 03/24/20  Yes [provider]    Family History Family History  Problem Relation Age of Onset  . Diabetes Mother   . Hypertension Mother   . Hyperlipidemia Mother     Social History Social History   Tobacco Use  . Smoking status: Former Smoker    Packs/day: 0.50    Years: 5.00    Pack years: 2.50    Types: Cigarettes  . Smokeless tobacco: Former Systems developer    Quit date: 08/12/2016  Substance Use Topics  . Alcohol use: Yes    Alcohol/week: 0.0 standard drinks    Comment: occasionally  . Drug use: Never  Allergies   Patient has no known allergies.   Review of Systems Review of Systems  Constitutional: Negative for activity change, chills, diaphoresis, fatigue and fever.  HENT: Negative.   Eyes: Negative.   Respiratory: Negative.   Cardiovascular: Positive for leg swelling.  Gastrointestinal: Negative.   Genitourinary: Negative.   Musculoskeletal: Negative for arthralgias, back pain, joint swelling, myalgias and stiffness.  Skin: Negative.   Neurological: Negative for numbness.     Physical Exam Triage Vital Signs ED Triage Vitals  Enc Vitals Group     BP 03/31/20 0904 120/84     Pulse Rate 03/31/20 0904 80     Resp 03/31/20 0904  15     Temp 03/31/20 0904 97.8 F (36.6 C)     Temp Source 03/31/20 0904 Oral     SpO2 03/31/20 0904 100 %     Weight 03/31/20 0918 110 lb (49.9 kg)     Height 03/31/20 0918 5\' 3"  (1.6 m)     Head Circumference --      Peak Flow --      Pain Score --      Pain Loc --      Pain Edu? --      Excl. in [place]? --    No data found.  Updated Vital Signs BP 120/84 (BP Location: Right Arm)   Pulse 80   Temp 97.8 F (36.6 C) (Oral)   Resp 15   Ht 5\' 3"  (1.6 m)   Wt 49.9 kg   SpO2 100%   BMI 19.49 kg/m   Visual Acuity Right Eye Distance:   Left Eye Distance:   Bilateral Distance:    Right Eye Near:   Left Eye Near:    Bilateral Near:     Physical Exam Vitals and nursing note reviewed.  Constitutional:      General: He is not in acute distress.    Appearance: He is not ill-appearing.  HENT:     Head: Normocephalic.     Right Ear: External ear normal.     Left Ear: External ear normal.     Nose: Nose normal.     Mouth/Throat:     Mouth: Mucous membranes are moist.  Eyes:     Conjunctiva/sclera: Conjunctivae normal.     Pupils: Pupils are equal, round, and reactive to light.  Cardiovascular:     Rate and Rhythm: Normal rate.     Pulses: Normal pulses.     Heart sounds: Normal heart sounds.  Pulmonary:     Breath sounds: Normal breath sounds.  Abdominal:     General: Abdomen is flat.  Musculoskeletal:     Cervical back: Normal range of motion.       Legs:     Comments: Right thigh is mildly swollen, without erythema.  There is tenderness to palpation over the right anterior/medial thigh extending to right groin where there is tender right inguinal adenopathy.  Tenderness extends to right lower quadrant as noted on diagram.   Skin:    General: Skin is warm and dry.     Findings: No erythema or rash.  Neurological:     Mental Status: He is alert.      UC Treatments / Results  Labs (all labs ordered are listed, but only abnormal results are displayed) Labs  Reviewed - No data to display  EKG   Radiology CT ABDOMEN PELVIS W CONTRAST  Result Date: 03/31/2020 CLINICAL DATA:  Deep vein thrombosis in legs, new EXAM:  CT ABDOMEN AND PELVIS WITH CONTRAST TECHNIQUE: Multidetector CT imaging of the abdomen and pelvis was performed using the standard protocol following bolus administration of intravenous contrast. Sagittal and coronal MPR images reconstructed from axial data set. CONTRAST:  133mL OMNIPAQUE IOHEXOL 300 MG/ML SOLN IV. No oral contrast. COMPARISON:  None. FINDINGS: Lower chest: Lung bases clear Hepatobiliary: Nonspecific low-attenuation focus lateral RIGHT lobe liver 7 x 6 mm image 11. Contracted gallbladder. Remainder of liver unremarkable Pancreas: Head and body displaced anteriorly by retroperitoneal mass see below. No focal pancreatic mass or ductal dilatation. Spleen: Normal appearance low to intermediate attenuation focus within anterior spleen 7 x 6 mm Adrenals/Urinary Tract: Adrenal glands normal appearance. Significant RIGHT hydronephrosis with obstructive proximal RIGHT ureter due to retroperitoneal mass. Delay in RIGHT renogram. Cyst at posterior mid RIGHT kidney. Soft tissue mass at anterior inferior RIGHT kidney question renal lesion versus invasion by adjacent retroperitoneal mass. Absent enhancement at inferior pole of LEFT kidney may represent an infarct though mass is not entirely excluded. Stomach/Bowel: Normal appendix. Stomach and bowel loops unremarkable Vascular/Lymphatic: Large soft tissue mass in LEFT and central retroperitoneum, 10.7 x 10.4 x 12.3 cm consistent with extensive adenopathy, partially necrotic. This extends LEFT para-aortic, into small bowel mesentery, and through celiac axis into porta hepatis. Extensive thrombosis of the IVC extending into the common iliac veins bilaterally and extending through the RIGHT external iliac vein into RIGHT common femoral vein. Questionable extension into LEFT external iliac vein.  Surrounding edema. Additional thickening/question tumor along anterior LEFT pararenal fascia. Mildly prominent RIGHT inguinal lymph node 12 mm short axis. Enlarged LEFT external iliac node 23 mm short axis. Minimally enlarged RIGHT external iliac node 11 mm short axis. Enlarged LEFT celiac node 17 mm short axis. Reproductive: Minimal prostatic enlargement. Unremarkable seminal vesicles. Other: Presacral and scattered pelvic edema. Mild retroperitoneal and perinephric edema. No free air. Minimal pelvic ascites. Musculoskeletal: Osseous structures unremarkable. 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 Extensive confluent adenopathy in the retroperitoneum in aggregate 10.7 x 10.4 x 12.3 cm with additional enlarged external iliac nodes bilaterally. This could represent lymphoma or metastatic disease. Recommend correlation with physical exam to exclude testicular mass/neoplasm. Suspected infarct versus less likely mass at inferior pole of LEFT kidney, may represent a venous infarct with IVC occlusion at this level. Additional small mass versus infarct inferior pole RIGHT kidney. RIGHT hydronephrosis and delay in RIGHT nephrogram secondary to a proximal RIGHT ureteral obstruction by retroperitoneal adenopathy. Findings called to Dr. Assunta Found on 03/31/2020 at 1245 hours. Electronically Signed   By: Lavonia Dana M.D.   On: 03/31/2020 12:51   US Venous Img Lower Unilateral Right  Result Date: 03/31/2020 CLINICAL DATA:  Right thigh pain EXAM: RIGHT LOWER EXTREMITY VENOUS DOPPLER ULTRASOUND TECHNIQUE: Gray-scale sonography with compression, as well as color and duplex ultrasound, were performed to evaluate the deep venous system(s) from the level of the common femoral vein through the popliteal and proximal calf veins. COMPARISON:  None. FINDINGS: VENOUS There is occlusive thrombus which extends from the mid/distal IVC through  the distal femoral vein. Thrombus involves right common femoral vein, saphenofemoral junction, profundal femoral vein and femoral vein with loss compressibility and phasicity. Limited views of the contralateral common femoral vein are unremarkable. OTHER A mass, which may be obstructing the IVC, is noted within the upper abdomen measuring approximately 9 x 10.3 x 7.8 cm. Limitations: none IMPRESSION: 1. Examination is positive for acute DVT  involving the right lower extremity. 2. A mass is suspected within the upper abdomen which is obstructing the IVC. Further evaluation with contrast enhanced CT of the abdomen pelvis is advised. Electronically Signed   By: Kerby Moors M.D.   On: 03/31/2020 11:06    Procedures Procedures (including critical care time)  Medications Ordered in UC Medications - No data to display  Initial Impression / Assessment and Plan / UC Course  I have reviewed the triage vital signs and the nursing notes.  Pertinent labs & imaging results that were available during my care of the patient were reviewed by me and considered in my medical decision making (see chart for details).    Discussed with Dr. Jena Gauss, oncologist at [facility].  Will transport patient via ambulance.  Vital signs stable for transport.   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

## 2020-03-31 NOTE — ED Triage Notes (Addendum)
Pain to inner right thigh - pt c/o swelling Pt denies injury Pt  States pain has increased over the past few weeks - feels like his muscles are pulling up in to his R groin Occasional sharp pain in R testicle Thigh warm to to touch  Pain started after his 2nd Covid vaccine (pt not sure if it was moderna or [employer] oil & an antibiotic that his mom had - unknown name one pill TID Pt has been taking it for a week

## 2020-04-01 MED ORDER — LACTATED RINGERS IV SOLN
INTRAVENOUS | Status: DC
Start: ? — End: 2020-04-01

## 2020-04-01 MED ORDER — PANTOPRAZOLE SODIUM 40 MG PO TBEC
40.00 | DELAYED_RELEASE_TABLET | ORAL | Status: DC
Start: 2020-04-03 — End: 2020-04-01

## 2020-04-01 MED ORDER — TAMSULOSIN HCL 0.4 MG PO CAPS
0.40 | ORAL_CAPSULE | ORAL | Status: DC
Start: 2020-04-02 — End: 2020-04-01

## 2020-04-01 MED ORDER — LIDOCAINE 4 % EX PTCH
1.00 | MEDICATED_PATCH | CUTANEOUS | Status: DC
Start: 2020-04-10 — End: 2020-04-01

## 2020-04-01 MED ORDER — ACETAMINOPHEN 325 MG PO TABS
650.00 | ORAL_TABLET | ORAL | Status: DC
Start: ? — End: 2020-04-01

## 2020-04-01 MED ORDER — FERROUS SULFATE 325 (65 FE) MG PO TABS
325.00 | ORAL_TABLET | ORAL | Status: DC
Start: 2020-04-10 — End: 2020-04-01

## 2020-04-02 MED ORDER — POLYETHYLENE GLYCOL 3350 17 GM/SCOOP PO POWD
17.00 | ORAL | Status: DC
Start: 2020-04-03 — End: 2020-04-02

## 2020-04-02 MED ORDER — GENERIC EXTERNAL MEDICATION
2.50 | Status: DC
Start: ? — End: 2020-04-02

## 2020-04-02 MED ORDER — SENNOSIDES-DOCUSATE SODIUM 8.6-50 MG PO TABS
2.00 | ORAL_TABLET | ORAL | Status: DC
Start: 2020-04-03 — End: 2020-04-02

## 2020-04-05 MED ORDER — POLYETHYLENE GLYCOL 3350 17 GM/SCOOP PO POWD
17.00 | ORAL | Status: DC
Start: 2020-04-06 — End: 2020-04-05

## 2020-04-05 MED ORDER — SENNOSIDES-DOCUSATE SODIUM 8.6-50 MG PO TABS
1.00 | ORAL_TABLET | ORAL | Status: DC
Start: 2020-04-05 — End: 2020-04-05

## 2020-04-05 MED ORDER — ACETAMINOPHEN 500 MG PO TABS
1000.00 | ORAL_TABLET | ORAL | Status: DC
Start: 2020-04-05 — End: 2020-04-05

## 2020-04-05 MED ORDER — GENERIC EXTERNAL MEDICATION
2.50 | Status: DC
Start: ? — End: 2020-04-05

## 2020-04-07 ENCOUNTER — Encounter: Payer: Self-pay | Admitting: *Deleted

## 2020-04-07 ENCOUNTER — Ambulatory Visit: Payer: 59 | Admitting: Gastroenterology

## 2020-04-07 MED ORDER — SENNOSIDES-DOCUSATE SODIUM 8.6-50 MG PO TABS
1.00 | ORAL_TABLET | ORAL | Status: DC
Start: ? — End: 2020-04-07

## 2020-04-07 MED ORDER — SENNOSIDES-DOCUSATE SODIUM 8.6-50 MG PO TABS
1.00 | ORAL_TABLET | ORAL | Status: DC
Start: 2020-04-09 — End: 2020-04-07

## 2020-04-07 MED ORDER — OXYCODONE-ACETAMINOPHEN 5-325 MG PO TABS
1.00 | ORAL_TABLET | ORAL | Status: DC
Start: ? — End: 2020-04-07

## 2020-04-07 MED ORDER — NALOXONE HCL 0.4 MG/ML IJ SOLN
0.40 | INTRAMUSCULAR | Status: DC
Start: ? — End: 2020-04-07

## 2020-04-07 MED ORDER — OXYCODONE HCL ER 10 MG PO T12A
10.00 | EXTENDED_RELEASE_TABLET | ORAL | Status: DC
Start: 2020-04-07 — End: 2020-04-07

## 2020-04-07 MED ORDER — POLYETHYLENE GLYCOL 3350 17 GM/SCOOP PO POWD
17.00 | ORAL | Status: DC
Start: 2020-04-09 — End: 2020-04-07

## 2020-04-07 NOTE — Progress Notes (Deleted)
Jonathon Bellows MD, MRCP(U.K) [address]  [place]  [place], [place] [address]  Main: [phone]  Fax: [phone]   Primary Care Physician: Mikey College, NP (Inactive)  Primary Gastroenterologist:  Dr. Jonathon Bellows   Abdominal pain follow-up  HPI: Trevor Stanton is a 44 y.o. male    Summary of history :  Initially referred and seen on 01/23/2020 for abdominal pain/atypical chest pain.In October 2020 she was seen by his  cardiologist for atypical chest pain.  Commenced on Prilosec 40 mg twice a day and advised to undergo gastroenterology evaluation and subsequent follow-up with cardiology.October 2020: CMP normal lipase normal. He speaks only [place] and has been in the [place] over 20 years.  He states that for over 2 years he has had generalized abdominal pain which last throughout the day and has not really changed over the past 2 years.  No clear aggravating factors but relieved after a good bowel movement.  He has a bowel movement once in 3 days and it is hard and nature.  No family history of colon polyps or colon cancer.  He takes naproxen daily for knee pain which has been long-term.  Says he has lost about 4 to 5 pounds of weight over the past 1 year.  He has been on Prilosec 40 mg twice a day with no significant improvement with the pain.  No prior endoscopic evaluation.  Was challenging to obtain a history from him despite the interpreter.  Probably a different dialect.  Interval history 01/23/2020-04/07/2020  01/23/2020: H. pylori breath test negative, hemoglobin 12.4 g, TSH normal  02/18/2020: EGD: Normal appearance, colonoscopy performed on the same date: 3 mm polyp resected in the ascending colon.  Biopsies of the stomach demonstrated no abnormality and the polyp resected in the colon was benign colonic mucosa.  He presented to the emergency room on 04/01/2019.  He complained of pain swelling the right medial aspect of his thigh 4 weeks after his second dose of  Covid vaccination.  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  Extensive confluent adenopathy in the retroperitoneum with additional enlarged external iliac nodes.  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  Suspected infarct versus less likely mass at inferior pole of left kidney may represent a venous infarct with IVC occlusion.  Right hydronephrosis.  He was directed to go to the ED of [facility] [place].  Had a percutaneous nephrostomy tube placed in.Commenced on heparin.  Underwent CT-guided biopsy of retroperitoneal mass.  Fine-needle aspiration showed only features of acute and chronic inflammation.  Current Outpatient Medications  Medication Sig Dispense Refill  . naproxen (NAPROSYN) 500 MG tablet TAKE 1 TABLET(500 MG) BY MOUTH TWICE DAILY WITH A MEAL FOR 1 TO 2 WEEKS THEN AS NEEDED 60 tablet 1  . pantoprazole (PROTONIX) 40 MG tablet Take 1 tablet (40 mg total) by mouth 2 (two) times daily. 60 tablet 5  . tamsulosin (FLOMAX) 0.4 MG CAPS capsule Take 0.4 mg by mouth daily.     No current facility-administered medications for this visit.    Allergies as of 04/07/2020  . (No Known Allergies)    ROS:  General: Negative for anorexia, weight loss, fever, chills, fatigue, weakness. ENT: Negative for hoarseness, difficulty swallowing , nasal congestion. CV: Negative for chest pain, angina, palpitations, dyspnea on exertion, peripheral edema.  Respiratory: Negative for dyspnea at rest, dyspnea on exertion, cough, sputum,  wheezing.  GI: See history of present illness. GU:  Negative for dysuria, hematuria, urinary incontinence, urinary frequency, nocturnal urination.  Endo: Negative for unusual weight change.    Physical Examination:   There were no vitals taken for this visit.  General: Well-nourished, well-developed in no acute distress.  Eyes: No icterus.  Conjunctivae pink. Mouth: Oropharyngeal mucosa moist and pink , no lesions erythema or exudate. Lungs: Clear to auscultation bilaterally. Non-labored. Heart: Regular rate and rhythm, no murmurs rubs or gallops.  Abdomen: Bowel sounds are normal, nontender, nondistended, no hepatosplenomegaly or masses, no abdominal bruits or hernia , no rebound or guarding.   Extremities: No lower extremity edema. No clubbing or deformities. Neuro: Alert and oriented x 3.  Grossly intact. Skin: Warm and dry, no jaundice.   Psych: Alert and cooperative, normal mood and affect.   Imaging Studies: CT ABDOMEN PELVIS W CONTRAST  Addendum Date: 04/01/2020   ADDENDUM REPORT: 04/01/2020 10:16 ADDENDUM: Omitted from initial dictation is incomplete opacification of the RIGHT renal vein and attenuated LEFT renal vein, likely related to significant thrombus in the IVC. Difficult to completely exclude distal renal vein thrombosis bilaterally. Electronically Signed   By: Lavonia Dana M.D.   On: 04/01/2020 10:16   Result Date: 04/01/2020 CLINICAL DATA:  Deep vein thrombosis in legs, new EXAM: CT ABDOMEN AND PELVIS WITH CONTRAST TECHNIQUE: Multidetector CT imaging of the abdomen and pelvis was performed using the standard protocol following bolus administration of intravenous contrast. Sagittal and coronal MPR images reconstructed from axial data set. CONTRAST:  175mL OMNIPAQUE IOHEXOL 300 MG/ML SOLN IV. No oral contrast. COMPARISON:  None. FINDINGS: Lower chest: Lung bases clear Hepatobiliary: Nonspecific low-attenuation focus lateral RIGHT lobe liver 7 x 6 mm image 11. Contracted gallbladder. Remainder of liver unremarkable Pancreas: Head and body displaced anteriorly by retroperitoneal mass see below. No focal pancreatic mass or ductal dilatation. Spleen: Normal appearance low to intermediate attenuation focus within anterior spleen 7 x 6 mm Adrenals/Urinary Tract: Adrenal glands normal appearance. Significant RIGHT hydronephrosis  with obstructive proximal RIGHT ureter due to retroperitoneal mass. Delay in RIGHT renogram. Cyst at posterior mid RIGHT kidney. Soft tissue mass at anterior inferior RIGHT kidney question renal lesion versus invasion by adjacent retroperitoneal mass. Absent enhancement at inferior pole of LEFT kidney may represent an infarct though mass is not entirely excluded. Stomach/Bowel: Normal appendix. Stomach and bowel loops unremarkable Vascular/Lymphatic: Large soft tissue mass in LEFT and central retroperitoneum, 10.7 x 10.4 x 12.3 cm consistent with extensive adenopathy, partially necrotic. This extends LEFT para-aortic, into small bowel mesentery, and through celiac axis into porta hepatis. Extensive thrombosis of the IVC extending into the common iliac veins bilaterally and extending through the RIGHT external iliac vein into RIGHT common femoral vein. Questionable extension into LEFT external iliac vein. Surrounding edema. Additional thickening/question tumor along anterior LEFT pararenal fascia. Mildly prominent RIGHT inguinal lymph node 12 mm short axis. Enlarged LEFT external iliac node 23 mm short axis. Minimally enlarged RIGHT external iliac node 11 mm short axis. Enlarged LEFT celiac node 17 mm short axis. Reproductive: Minimal prostatic enlargement. Unremarkable seminal vesicles. Other: Presacral and scattered pelvic edema. Mild retroperitoneal and perinephric edema. No free air. Minimal pelvic ascites. Musculoskeletal: Osseous structures unremarkable. 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 Extensive confluent adenopathy in the retroperitoneum in aggregate 10.7 x 10.4 x 12.3 cm with additional enlarged external iliac nodes bilaterally. This could represent lymphoma or metastatic  disease. Recommend correlation with physical exam to exclude testicular mass/neoplasm. Suspected infarct versus less likely mass at  inferior pole of LEFT kidney, may represent a venous infarct with IVC occlusion at this level. Additional small mass versus infarct inferior pole RIGHT kidney. RIGHT hydronephrosis and delay in RIGHT nephrogram secondary to a proximal RIGHT ureteral obstruction by retroperitoneal adenopathy. Findings called to Dr. Assunta Found on 03/31/2020 at 1245 hours. Electronically Signed: By: Lavonia Dana M.D. On: 03/31/2020 12:51   US Venous Img Lower Unilateral Right  Result Date: 03/31/2020 CLINICAL DATA:  Right thigh pain EXAM: RIGHT LOWER EXTREMITY VENOUS DOPPLER ULTRASOUND TECHNIQUE: Gray-scale sonography with compression, as well as color and duplex ultrasound, were performed to evaluate the deep venous system(s) from the level of the common femoral vein through the popliteal and proximal calf veins. COMPARISON:  None. FINDINGS: VENOUS There is occlusive thrombus which extends from the mid/distal IVC through the distal femoral vein. Thrombus involves right common femoral vein, saphenofemoral junction, profundal femoral vein and femoral vein with loss compressibility and phasicity. Limited views of the contralateral common femoral vein are unremarkable. OTHER A mass, which may be obstructing the IVC, is noted within the upper abdomen measuring approximately 9 x 10.3 x 7.8 cm. Limitations: none IMPRESSION: 1. Examination is positive for acute DVT involving the right lower extremity. 2. A mass is suspected within the upper abdomen which is obstructing the IVC. Further evaluation with contrast enhanced CT of the abdomen pelvis is advised. Electronically Signed   By: Kerby Moors M.D.   On: 03/31/2020 11:06    Assessment and Plan:   Trevor Stanton is a 44 y.o. y/o male here to follow-up for abdominal pain of over 2 years duration.  Not responded to a trial of PPI.  Some weight loss over the past 1 year.  Long-term use of NSAIDs.  Also has constipation which is relatively new in onset.  Plan 1.  Commence on MiraLAX 1 capful  daily. 4.  If above evaluation does not help or does not produce resolution of the problem then would consider CT scan of the abdomen next visit.  Dr Jonathon Bellows  MD,MRCP [facility]) Follow up in ***

## 2020-04-09 MED ORDER — MORPHINE SULFATE ER 15 MG PO TBCR
15.00 | EXTENDED_RELEASE_TABLET | ORAL | Status: DC
Start: 2020-04-10 — End: 2020-04-09

## 2020-04-09 MED ORDER — HEPARIN SOD (PORCINE) IN D5W 100 UNIT/ML IV SOLN
5.00 | INTRAVENOUS | Status: DC
Start: ? — End: 2020-04-09

## 2020-04-14 ENCOUNTER — Other Ambulatory Visit: Payer: Self-pay

## 2020-04-14 NOTE — Patient Outreach (Signed)
[facility]) Care Management  04/14/2020  [name] January 21, 1976 [id number]   New referral: Reviewed epic and noted that patient is new diagnosis of lymphoma. Discharged on 04/09/2020 with follow up planned at [place] which is in network for patient.   Placed call to patient to engage in services, with no answer.  Plan:  Will mail outreach letter and attempt telephone outreach again in 3 days.  Tomasa Rand, RN, BSN, CEN [facility] [employer] [phone]

## 2020-04-16 ENCOUNTER — Ambulatory Visit: Payer: PRIVATE HEALTH INSURANCE | Admitting: Family Medicine

## 2020-04-17 ENCOUNTER — Encounter: Payer: Self-pay | Admitting: Family Medicine

## 2020-04-17 ENCOUNTER — Other Ambulatory Visit: Payer: Self-pay

## 2020-04-18 NOTE — Patient Outreach (Signed)
[name]) Care Management  04/18/2020  Trevor Stanton 1976/09/09 [id number]   Outreach attempt: Unsuccessful outreach attempt to patient.  Voicemail not set up so I could not leave a message.  Reviewed new records in [place].  PLAN: will plan 3rd outreach attempt in 3 days.  Tomasa Rand, RN, BSN, CEN [facility] [employer] [phone]

## 2020-04-20 ENCOUNTER — Other Ambulatory Visit: Payer: Self-pay | Admitting: Family Medicine

## 2020-04-20 DIAGNOSIS — M25562 Pain in left knee: Secondary | ICD-10-CM

## 2020-04-20 NOTE — Telephone Encounter (Signed)
Naproxen 500 mg tablets  Requested medications are due for refill today?  Yes - Naprosyn (Naproxen 500 mg is not on preferred formulary for patient's insurance.  Please see below.    Requested medications are on active medication list?  Yes  Last Refill:   02/08/2020  # 60 with one refill   Future visit scheduled?  No   Notes to Clinic:   Why Am I Seeing These Alternatives?   naproxen (NAPROSYN) 500 MG tablet [Pharmacy Med Name: NAPROXEN 500MG  TABLETS] is not on the preferred formulary for the patient's insurance plan. Below are alternatives which are likely to be more affordable. Do not assume that every medication presented is a clinically appropriate alternative.  These alternatives are medications that are in the same pharmaceutical subclass (Nonsteroidal Anti-inflammatory Agents (NSAIDs)) as the ordered medication and are on formulary for the patient's insurance plan.   Tamsulosin  Requested medications are due for refill today?  Uncertain.  Listed as historical medication.    Requested medications are on active medication list?  Yes as historical med  Last Refill:   Uncertain   Future visit scheduled?  No   Notes to Clinic:  Unable to refill historical medication.

## 2020-04-22 ENCOUNTER — Other Ambulatory Visit: Payer: Self-pay

## 2020-04-22 NOTE — Patient Outreach (Signed)
[facility]) Care Management  04/22/2020  Trevor Stanton [birth year] [id number]   Placed call to patient who answered.  Inquired about if he understood and could speak english and he said not really. I informed patient I would call an interpreter and call him right back.  I placed call to language line and had translator ANH call patient.  First call said all circuits are busy.  Asked interpreter to call back and he did. Same message.  PLAN: will try again to outreach patient.  Tomasa Rand, RN, BSN, CEN [place] Coordinator [phone]   1:48 pm  Translator  [facility]  ID # [id number] Second call to patient through the language line. Patient answered.  Reports he is now being seen at [facility] for his health care as it is now in network.  Reports he goes to MD today and has  Biopsy planned for 04/24/2020.Marland Kitchen Reports he needs help with his medication cost, help with transportation, meals and a caregiver. Reports he is not able to work.    Patient denies any nursing needs at this time. I reminded patient to make sure when he sees the doctor he has an interpreter to ensure his understanding.    Patient voiced understanding.   PLAN: will placed order for pharmacy and social worker.Will close to nursing.  Tomasa Rand, RN, BSN, CEN [facility] [employer] [phone]

## 2020-04-22 NOTE — Patient Outreach (Signed)
Referral to [facility] from Tomasa Rand, RN for Medication Management on 04/22/20 LM Peighton Mehra

## 2020-04-22 NOTE — Addendum Note (Signed)
Addended by: Thana Ates on: 04/22/2020 02:28 PM   Modules accepted: Orders

## 2020-04-24 ENCOUNTER — Encounter: Payer: Self-pay | Admitting: *Deleted

## 2020-04-24 ENCOUNTER — Other Ambulatory Visit: Payer: Self-pay | Admitting: *Deleted

## 2020-04-24 NOTE — Patient Outreach (Signed)
[facility]) Care Management  04/24/2020  Trevor Stanton 22-Jan-1976 [id number]  CSW made an attempt to try and contact patient today to perform the initial phone assessment, as well as assess and assist with social work needs and services, using the [employer] [phone]), requesting a [place] interpreter (ID # [phone]).  However, patient was unavailable at the time of CSW's call.  CSW was able to leave a [place] HIPAA compliant message on voicemail for patient with Alen Bleacher, per patient's request, at the number provided.  In the message to patient, CSW explained that he will be receiving a packet of resource information in the mail, including transportation services, food assistance and caregiver resources.    CSW was not able to leave a HIPAA compliant message for patient on his mobile number, receiving an automated recording that patient's voicemail has not been set up with the system.  CSW will await a return call.  CSW will make a second outreach attempt within the next 3-4 business days, on Wednesday, April 30, 2020, around 10:00am, if a return call is not received from patient in the meantime.  CSW will also mail an Outreach Letter to patient's home, in [place], requesting that patient contact CSW if he is interested in receiving social work services through [place] with Scientist, clinical (histocompatibility and immunogenetics).  Nat Christen, BSW, MSW, LCSW  Licensed Education officer, environmental Health System  Mailing [place] [address] [place], [place] [address] Physical Address-[address], [place], [place] [address] Toll Free Main # [phone] Fax # [phone] Cell # [phone]  Office # [phone] [email or web address]

## 2020-04-30 ENCOUNTER — Other Ambulatory Visit: Payer: Self-pay | Admitting: *Deleted

## 2020-04-30 ENCOUNTER — Encounter: Payer: Self-pay | Admitting: *Deleted

## 2020-04-30 NOTE — Patient Outreach (Signed)
[facility]) Care Management  04/30/2020  Jacyn Muenchow [birth year] [id number]   CSW made a second attempt to try and contact patient today to perform the initial phone assessment, as well as assess and assist with social work needs and services, using the [employer] [phone]), requesting a [place] interpreter (ID # [phone]).  However, patient was unavailable at the time of CSW's call.  CSW was able to leave a [place] HIPAA compliant message on voicemail for patient with Alen Bleacher, per patient's request, at the number provided.  In the message to patient, CSW wanted to confirm that patient received the packet of resource information mailed to his home by CSW, which included transportation services, food assistance and caregiver resources.    CSW was not able to leave a HIPAA compliant message for patient on his mobile number, receiving an automated recording that patient's voicemail has not been set up with the system.  CSW will await a return call.  CSW will make a second outreach attempt within the next 3-4 business days, on Monday, May 05, 2020, around 9:00am, if a return call is not received from patient in the meantime.  CSW will encourage Ms. Lacinda Axon, patient's RNCM, also with [place] Management, to have patient contact CSW directly, if she is able to make successful outreach to patient.   Nat Christen, BSW, MSW, LCSW  Licensed Education officer, environmental Health System  Mailing [place] [address], [place], [place] [address] Physical Address-[address], [place], [place] [address] Toll Free Main # [phone] Fax # [phone] Cell # [phone]  Office # [phone] [email or web address]

## 2020-05-05 ENCOUNTER — Other Ambulatory Visit: Payer: Self-pay | Admitting: *Deleted

## 2020-05-05 NOTE — Patient Outreach (Addendum)
[facility]) Care Management  05/05/2020  Trevor Stanton 06-04-1976 [id number]   CSW made a third attempt to try and contact patient today to perform the initial phone assessment, as well as assess and assist with social work needs and [place] [phone]), requesting a [place] interpreter (ID # [phone]). However, patient was unavailable at the time of CSW's call. CSW was able to leave a [place] HIPAA compliant Nurse, children's for patient with Alen Bleacher, per patient's request, at the number provided.  In the message to patient, CSW wanted to confirm that patient received the packet of resource information mailed to his home by CSW, which included transportation services, food assistance and caregiver resources.   CSW was not able to leave a HIPAA compliant message for patient on his mobile number, receiving an automated recording that patient's voicemail has not been set up with the system.CSWwill await a return call.CSW will make a fourth and final outreach attempt within the next 4 weeks,on Tuesday, June 03, 2020, around 10:00am, if a return call is not received from patient in the meantime.  CSW will encourage Ms. Lacinda Axon, patient's RNCM, also with [facility], to have patient contact CSW directly, if she is able to make successful outreach to patient or Mr. Wynetta Emery.   Nat Christen, BSW, MSW, LCSW  Licensed Education officer, environmental Health System  Mailing [place] [address] [place], [place] [address] Physical Address-[address], [place], [place] [address] Toll Free Main # [phone] Fax # [phone] Cell # [phone]  Office # [phone] [email or web address]

## 2020-05-26 ENCOUNTER — Ambulatory Visit: Payer: Self-pay | Admitting: *Deleted

## 2020-06-03 ENCOUNTER — Other Ambulatory Visit: Payer: Self-pay | Admitting: *Deleted

## 2020-06-03 ENCOUNTER — Encounter: Payer: Self-pay | Admitting: *Deleted

## 2020-06-03 NOTE — Patient Outreach (Signed)
[facility]) Care Management  06/03/2020  Trevor Stanton November 07, 1976 [id number]    CSW made a fourth and final attempt to try and contact patient today to perform the initial phone assessment, as well as assess and assist with social work needs and services; however, patient was always unavailable at the time of CSW's calls.  CSW has left HIPAA compliant messages on voicemail for patient, while awaiting a return call.  CSW will proceed with case closure, as required number of phone attempts were made and Unsuccessful Outreach Letter mailed to patient's home, allowing a total of 4 weeks for patient to respond if patient was interested in receiving social work services and resources through [place] with Scientist, clinical (histocompatibility and immunogenetics).  CSW will notify patient's Primary Care Physician, Dr. Cassell Smiles of CSW's plans to close patient's case, as well as send a Physician Case Closure Letter.  CSW will also mail a Case Closure Letter to patient's home.  Last, CSW will route this note to Tomasa Rand, [facility] and individual placing the referral.  Nat Christen, BSW, MSW, [place]  Licensed Clinical Social Worker  [place]  Mailing [place] [place], [place] [address] Physical Address-[address], [place], [place] [address] Toll Free Main # [phone] Fax # [phone] Cell # [phone]  Office # [phone] [email or web address]

## 2021-06-07 IMAGING — CT CT ABD-PELV W/ CM
2 of 5 series · 12 of 46 positions shown, 14 images · IV contrast (APPLIED)
Comparison: None.
COMPARISON: None.

Addendum:
CLINICAL DATA: Deep vein thrombosis in legs, new

EXAM:
CT ABDOMEN AND PELVIS WITH CONTRAST
TECHNIQUE: Multidetector CT imaging of the abdomen and pelvis was performed
using the standard protocol following bolus administration of
intravenous contrast. Sagittal and coronal MPR images reconstructed
from axial data set.
CONTRAST:  100mL OMNIPAQUE IOHEXOL 300 MG/ML SOLN IV. No oral
contrast.

[Series 2: axial st · axial · 0.77mm/px · z∈[-410,-10]mm · 9 of 96 slices shown, 11 images]
[im 11/96  soft-tissue]
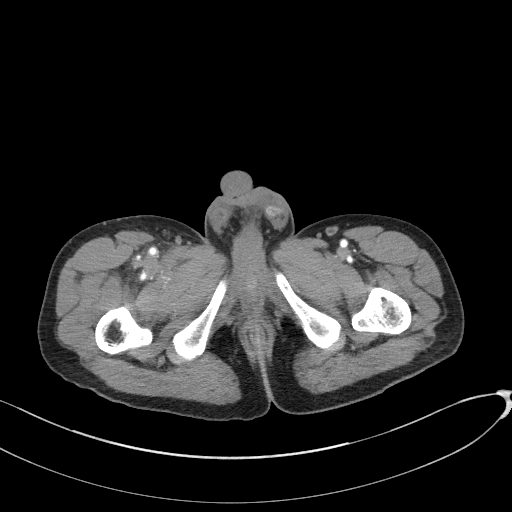
[im 11/96  bone]
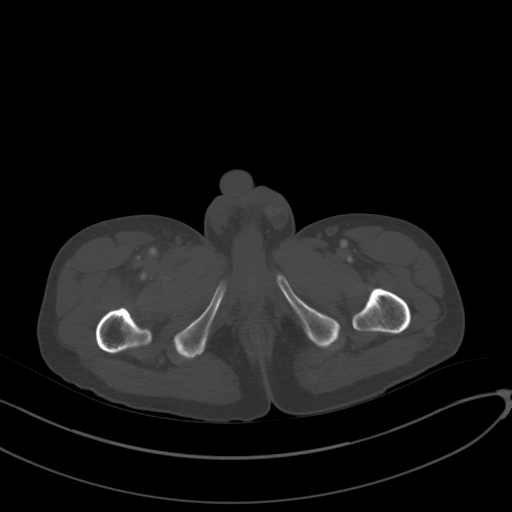
[im 21/96  soft-tissue]
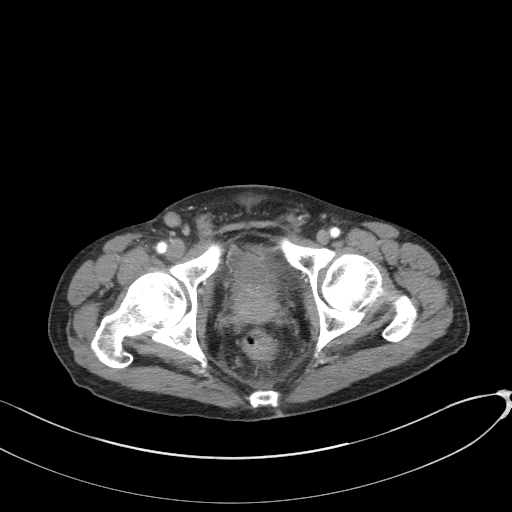
[im 31/96  soft-tissue]
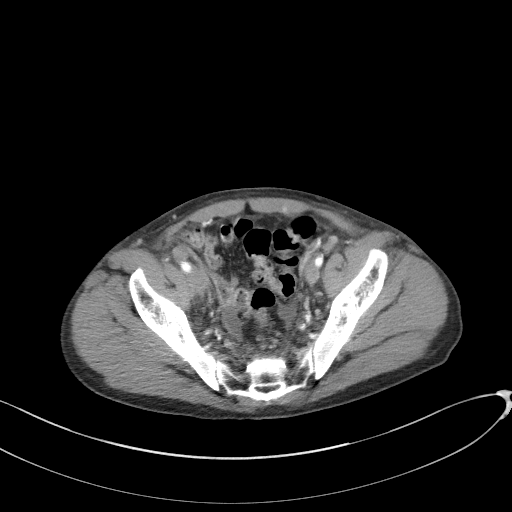
[im 41/96  soft-tissue]
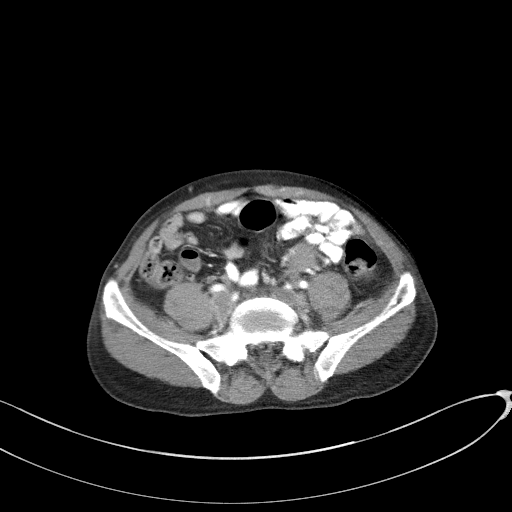
[im 51/96  soft-tissue]
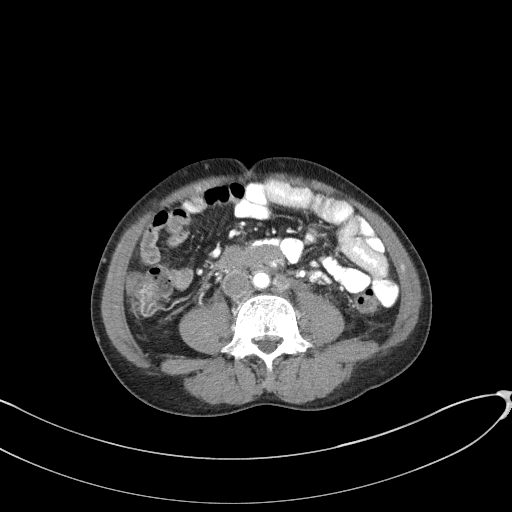
[im 61/96  soft-tissue]
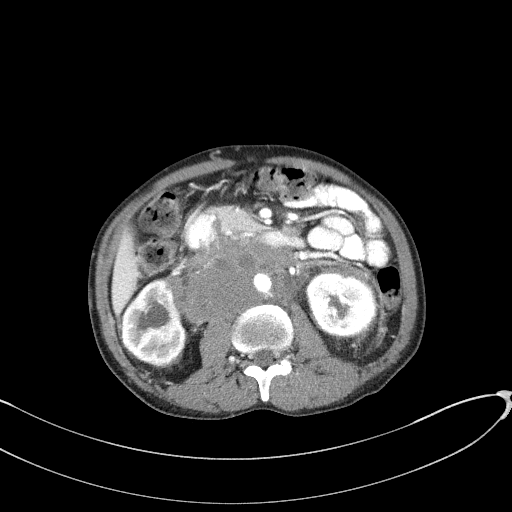
[im 71/96  soft-tissue]
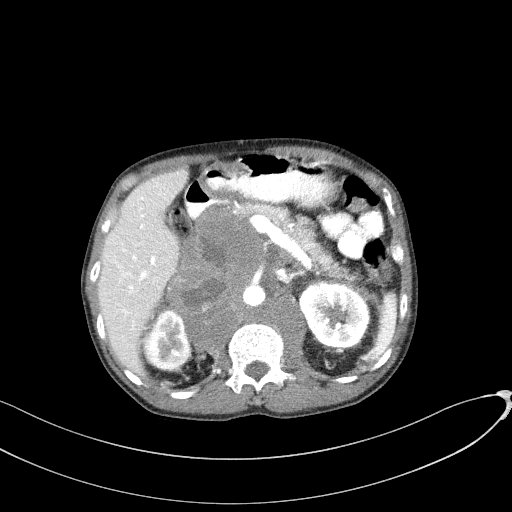
[im 81/96  soft-tissue]
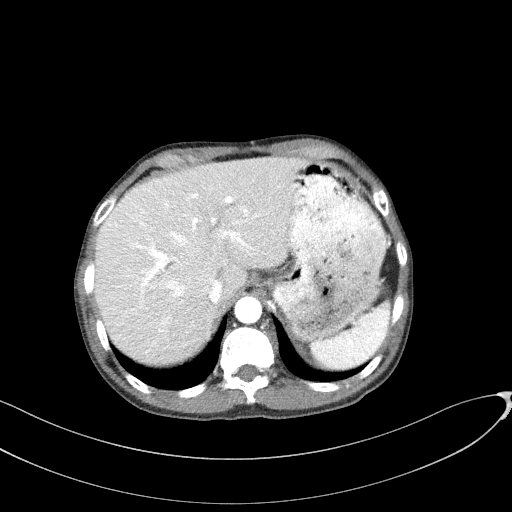
[im 91/96  soft-tissue]
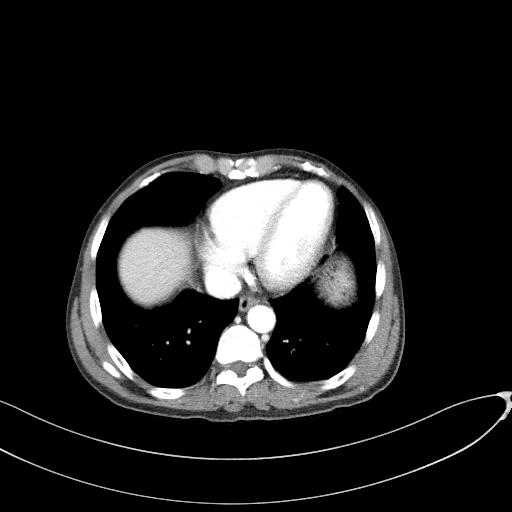
[im 91/96  bone]
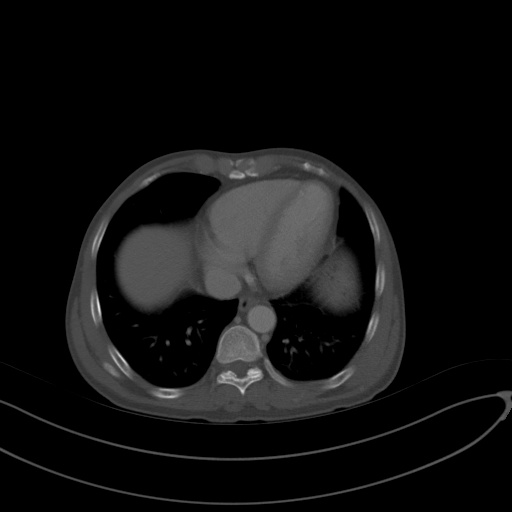

[Series 5: coronal st · coronal · 0.69mm/px · 3 of 75 slices shown]
[im 25/75  soft-tissue]
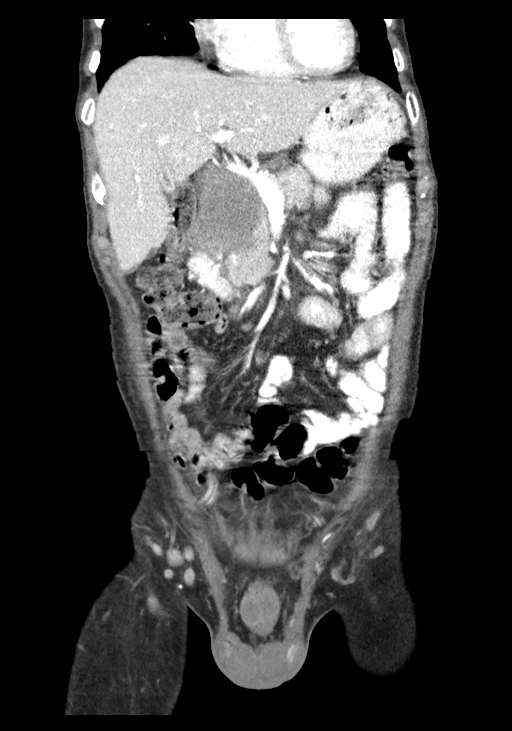
[im 33/75  soft-tissue]
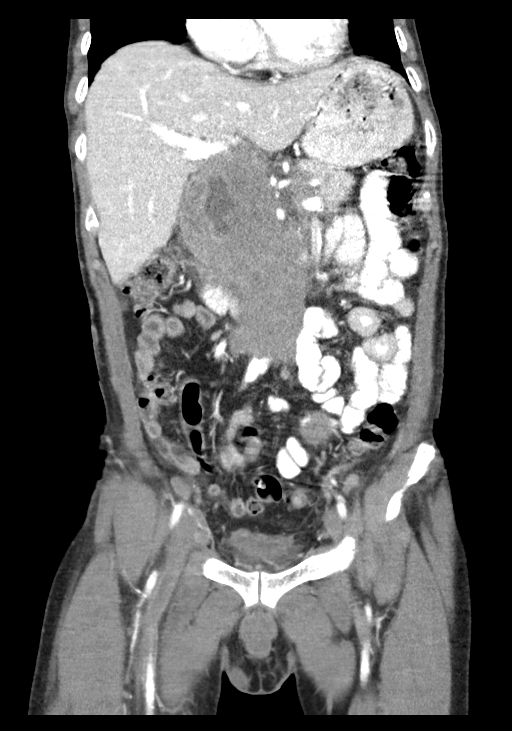
[im 42/75  soft-tissue]
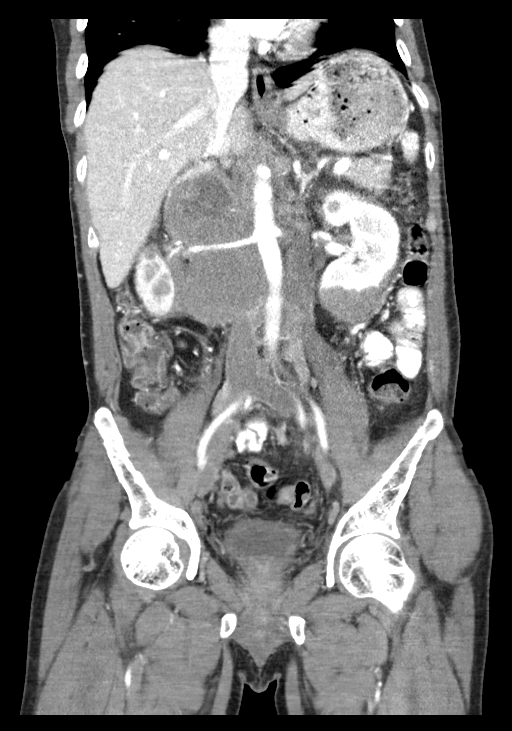

[12 of 46 positions shown; findings below may reference images not displayed]

FINDINGS: Lower chest: Lung bases clear

Hepatobiliary: Nonspecific low-attenuation focus lateral RIGHT lobe
liver 7 x 6 mm image 11. Contracted gallbladder. Remainder of liver
unremarkable

Pancreas: Head and body displaced anteriorly by retroperitoneal mass
see below. No focal pancreatic mass or ductal dilatation.

Spleen: Normal appearance low to intermediate attenuation focus
within anterior spleen 7 x 6 mm

Adrenals/Urinary Tract: Adrenal glands normal appearance.
Significant RIGHT hydronephrosis with obstructive proximal RIGHT
ureter due to retroperitoneal mass. Delay in RIGHT renogram. Cyst at
posterior mid RIGHT kidney. Soft tissue mass at anterior inferior
RIGHT kidney question renal lesion versus invasion by adjacent
retroperitoneal mass. Absent enhancement at inferior pole of LEFT
kidney may represent an infarct though mass is not entirely
excluded.

Stomach/Bowel: Normal appendix. Stomach and bowel loops unremarkable

Vascular/Lymphatic: Large soft tissue mass in LEFT and central
retroperitoneum, 10.7 x 10.4 x 12.3 cm consistent with extensive
adenopathy, partially necrotic. This extends LEFT para-aortic, into
small bowel mesentery, and through celiac axis into porta hepatis.
Extensive thrombosis of the IVC extending into the common iliac
veins bilaterally and extending through the RIGHT external iliac
vein into RIGHT common femoral vein. Questionable extension into
LEFT external iliac vein. Surrounding edema. Additional
thickening/question tumor along anterior LEFT pararenal fascia.
Mildly prominent RIGHT inguinal lymph node 12 mm short axis.
Enlarged LEFT external iliac node 23 mm short axis. Minimally
enlarged RIGHT external iliac node 11 mm short axis. Enlarged LEFT
celiac node 17 mm short axis.

Reproductive: Minimal prostatic enlargement. Unremarkable seminal
vesicles.

Other: Presacral and scattered pelvic edema. Mild retroperitoneal
and perinephric edema. No free air. Minimal pelvic ascites.

Musculoskeletal: Osseous structures unremarkable.
IMPRESSION: Extensive deep venous thrombosis involving the IVC, common iliac
veins, RIGHT external iliac vein, and RIGHT common femoral vein.

Extensive confluent adenopathy in the retroperitoneum in aggregate
10.7 x 10.4 x 12.3 cm with additional enlarged external iliac nodes
bilaterally.

This could represent lymphoma or metastatic disease.

Recommend correlation with physical exam to exclude testicular
mass/neoplasm.

Suspected infarct versus less likely mass at inferior pole of LEFT
kidney, may represent a venous infarct with IVC occlusion at this
level.

Additional small mass versus infarct inferior pole RIGHT kidney.

RIGHT hydronephrosis and delay in RIGHT nephrogram secondary to a
proximal RIGHT ureteral obstruction by retroperitoneal adenopathy.

Findings called to Dr. Tomassini on 03/31/2020 at 1447 hours.

ADDENDUM:
Omitted from initial dictation is incomplete opacification of the
RIGHT renal vein and attenuated LEFT renal vein, likely related to
significant thrombus in the IVC. Difficult to completely exclude
distal renal vein thrombosis bilaterally.

*** End of Addendum ***
FINDINGS: Lower chest: Lung bases clear

Hepatobiliary: Nonspecific low-attenuation focus lateral RIGHT lobe
liver 7 x 6 mm image 11. Contracted gallbladder. Remainder of liver
unremarkable

Pancreas: Head and body displaced anteriorly by retroperitoneal mass
see below. No focal pancreatic mass or ductal dilatation.

Spleen: Normal appearance low to intermediate attenuation focus
within anterior spleen 7 x 6 mm

Adrenals/Urinary Tract: Adrenal glands normal appearance.
Significant RIGHT hydronephrosis with obstructive proximal RIGHT
ureter due to retroperitoneal mass. Delay in RIGHT renogram. Cyst at
posterior mid RIGHT kidney. Soft tissue mass at anterior inferior
RIGHT kidney question renal lesion versus invasion by adjacent
retroperitoneal mass. Absent enhancement at inferior pole of LEFT
kidney may represent an infarct though mass is not entirely
excluded.

Stomach/Bowel: Normal appendix. Stomach and bowel loops unremarkable

Vascular/Lymphatic: Large soft tissue mass in LEFT and central
retroperitoneum, 10.7 x 10.4 x 12.3 cm consistent with extensive
adenopathy, partially necrotic. This extends LEFT para-aortic, into
small bowel mesentery, and through celiac axis into porta hepatis.
Extensive thrombosis of the IVC extending into the common iliac
veins bilaterally and extending through the RIGHT external iliac
vein into RIGHT common femoral vein. Questionable extension into
LEFT external iliac vein. Surrounding edema. Additional
thickening/question tumor along anterior LEFT pararenal fascia.
Mildly prominent RIGHT inguinal lymph node 12 mm short axis.
Enlarged LEFT external iliac node 23 mm short axis. Minimally
enlarged RIGHT external iliac node 11 mm short axis. Enlarged LEFT
celiac node 17 mm short axis.

Reproductive: Minimal prostatic enlargement. Unremarkable seminal
vesicles.

Other: Presacral and scattered pelvic edema. Mild retroperitoneal
and perinephric edema. No free air. Minimal pelvic ascites.

Musculoskeletal: Osseous structures unremarkable.
IMPRESSION: Extensive deep venous thrombosis involving the IVC, common iliac
veins, RIGHT external iliac vein, and RIGHT common femoral vein.

Extensive confluent adenopathy in the retroperitoneum in aggregate
10.7 x 10.4 x 12.3 cm with additional enlarged external iliac nodes
bilaterally.

This could represent lymphoma or metastatic disease.

Recommend correlation with physical exam to exclude testicular
mass/neoplasm.

Suspected infarct versus less likely mass at inferior pole of LEFT
kidney, may represent a venous infarct with IVC occlusion at this
level.

Additional small mass versus infarct inferior pole RIGHT kidney.

RIGHT hydronephrosis and delay in RIGHT nephrogram secondary to a
proximal RIGHT ureteral obstruction by retroperitoneal adenopathy.

Findings called to Dr. Tomassini on 03/31/2020 at 1447 hours.

## 2023-07-06 ENCOUNTER — Ambulatory Visit (HOSPITAL_COMMUNITY)
Admission: RE | Admit: 2023-07-06 | Discharge: 2023-07-06 | Disposition: A | Discharge status: Home / Self Care | Payer: BC Managed Care – PPO | Source: Ambulatory Visit | Attending: Nurse Practitioner | Admitting: Nurse Practitioner

## 2023-07-06 ENCOUNTER — Encounter (HOSPITAL_COMMUNITY): Payer: Self-pay

## 2023-07-06 DIAGNOSIS — S60032A Contusion of left middle finger without damage to nail, initial encounter: Secondary | ICD-10-CM

## 2023-07-06 DIAGNOSIS — S0003XA Contusion of scalp, initial encounter: Secondary | ICD-10-CM | POA: Diagnosis not present

## 2023-07-06 DIAGNOSIS — S8002XA Contusion of left knee, initial encounter: Secondary | ICD-10-CM

## 2023-07-06 DIAGNOSIS — S80811A Abrasion, right lower leg, initial encounter: Secondary | ICD-10-CM

## 2023-07-06 DIAGNOSIS — S50812A Abrasion of left forearm, initial encounter: Secondary | ICD-10-CM

## 2023-07-06 MED ORDER — IBUPROFEN 800 MG PO TABS: 800.0000 mg | ORAL_TABLET | Freq: Three times a day (TID) | ORAL | 0 refills | Status: AC | PRN

## 2023-07-06 MED ORDER — TIZANIDINE HCL 4 MG PO TABS: 4.0000 mg | ORAL_TABLET | Freq: Three times a day (TID) | ORAL | 0 refills | Status: AC | PRN
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
